# Patient Record
Sex: Male | Born: 1945 | Race: Black or African American | Hispanic: No | Marital: Single | Smoking: Current some day smoker
Health system: Southern US, Community
[De-identification: ages and names within clinical notes are randomized; demographics above are authoritative.]

## PROBLEM LIST (undated history)

## (undated) DIAGNOSIS — E7800 Pure hypercholesterolemia, unspecified: Secondary | ICD-10-CM

## (undated) DIAGNOSIS — R001 Bradycardia, unspecified: Secondary | ICD-10-CM

## (undated) DIAGNOSIS — I10 Essential (primary) hypertension: Secondary | ICD-10-CM

## (undated) DIAGNOSIS — E119 Type 2 diabetes mellitus without complications: Secondary | ICD-10-CM

## (undated) HISTORY — DX: Essential (primary) hypertension: I10

## (undated) HISTORY — PX: INNER EAR SURGERY: SHX679

## (undated) HISTORY — PX: CORNEAL TRANSPLANT: SHX108

## (undated) HISTORY — DX: Type 2 diabetes mellitus without complications: E11.9

## (undated) HISTORY — PX: EYE SURGERY: SHX253

## (undated) HISTORY — DX: Bradycardia, unspecified: R00.1

## (undated) HISTORY — DX: Pure hypercholesterolemia, unspecified: E78.00

---

## 1968-09-17 HISTORY — PX: APPENDECTOMY: SHX54

## 2006-09-17 HISTORY — PX: COLONOSCOPY: SHX174

## 2007-04-21 ENCOUNTER — Encounter: Admission: RE | Admit: 2007-04-21 | Discharge: 2007-04-21 | Payer: Self-pay | Admitting: Family Medicine

## 2007-06-12 ENCOUNTER — Encounter: Admission: RE | Admit: 2007-06-12 | Discharge: 2007-06-25 | Payer: Self-pay | Admitting: Family Medicine

## 2007-07-18 ENCOUNTER — Encounter: Admission: RE | Admit: 2007-07-18 | Discharge: 2007-07-18 | Payer: Self-pay | Admitting: Orthopedic Surgery

## 2008-03-01 ENCOUNTER — Encounter: Admission: RE | Admit: 2008-03-01 | Discharge: 2008-03-09 | Payer: Self-pay | Admitting: Cardiology

## 2009-02-25 ENCOUNTER — Encounter: Payer: Self-pay | Admitting: Cardiology

## 2009-05-26 ENCOUNTER — Encounter: Payer: Self-pay | Admitting: Cardiology

## 2009-08-25 ENCOUNTER — Encounter: Payer: Self-pay | Admitting: Cardiology

## 2009-09-17 DIAGNOSIS — E119 Type 2 diabetes mellitus without complications: Secondary | ICD-10-CM

## 2009-09-17 HISTORY — DX: Type 2 diabetes mellitus without complications: E11.9

## 2010-11-08 NOTE — Letter (Signed)
Summary: [facility] Office Note   [facility] Office Note   Imported By: Roderic Ovens 11/02/2010 12:08:22  _____________________________________________________________________  External Attachment:    Type:   Image     Comment:   External Document

## 2010-11-08 NOTE — Letter (Signed)
Summary: [facility] Office Note   [facility] Office Note   Imported By: Roderic Ovens 11/02/2010 12:10:26  _____________________________________________________________________  External Attachment:    Type:   Image     Comment:   External Document

## 2010-11-08 NOTE — Letter (Signed)
Summary: [facility] Office Note   [facility] Office Note   Imported By: Roderic Ovens 11/02/2010 12:11:35  _____________________________________________________________________  External Attachment:    Type:   Image     Comment:   External Document

## 2011-04-09 ENCOUNTER — Other Ambulatory Visit: Payer: Self-pay | Admitting: Cardiology

## 2012-05-23 ENCOUNTER — Encounter: Payer: Self-pay | Admitting: Cardiology

## 2013-05-25 ENCOUNTER — Ambulatory Visit: Payer: Medicare Other | Attending: Internal Medicine | Admitting: Physical Therapy

## 2013-05-25 DIAGNOSIS — M25579 Pain in unspecified ankle and joints of unspecified foot: Secondary | ICD-10-CM | POA: Insufficient documentation

## 2013-05-25 DIAGNOSIS — IMO0001 Reserved for inherently not codable concepts without codable children: Secondary | ICD-10-CM | POA: Insufficient documentation

## 2015-11-07 DIAGNOSIS — I10 Essential (primary) hypertension: Secondary | ICD-10-CM | POA: Diagnosis not present

## 2015-11-07 DIAGNOSIS — N19 Unspecified kidney failure: Secondary | ICD-10-CM | POA: Diagnosis not present

## 2015-11-07 DIAGNOSIS — E119 Type 2 diabetes mellitus without complications: Secondary | ICD-10-CM | POA: Diagnosis not present

## 2015-11-07 DIAGNOSIS — R5383 Other fatigue: Secondary | ICD-10-CM | POA: Diagnosis not present

## 2015-11-15 DIAGNOSIS — I10 Essential (primary) hypertension: Secondary | ICD-10-CM | POA: Diagnosis not present

## 2015-11-15 DIAGNOSIS — J209 Acute bronchitis, unspecified: Secondary | ICD-10-CM | POA: Diagnosis not present

## 2015-11-15 DIAGNOSIS — M542 Cervicalgia: Secondary | ICD-10-CM | POA: Diagnosis not present

## 2015-11-15 DIAGNOSIS — E119 Type 2 diabetes mellitus without complications: Secondary | ICD-10-CM | POA: Diagnosis not present

## 2016-01-02 DIAGNOSIS — H7201 Central perforation of tympanic membrane, right ear: Secondary | ICD-10-CM | POA: Diagnosis not present

## 2016-01-02 DIAGNOSIS — H903 Sensorineural hearing loss, bilateral: Secondary | ICD-10-CM | POA: Diagnosis not present

## 2016-04-05 DIAGNOSIS — M79609 Pain in unspecified limb: Secondary | ICD-10-CM | POA: Diagnosis not present

## 2016-04-05 DIAGNOSIS — L309 Dermatitis, unspecified: Secondary | ICD-10-CM | POA: Diagnosis not present

## 2016-04-05 DIAGNOSIS — I10 Essential (primary) hypertension: Secondary | ICD-10-CM | POA: Diagnosis not present

## 2016-04-05 DIAGNOSIS — R21 Rash and other nonspecific skin eruption: Secondary | ICD-10-CM | POA: Diagnosis not present

## 2016-04-12 DIAGNOSIS — H40021 Open angle with borderline findings, high risk, right eye: Secondary | ICD-10-CM | POA: Diagnosis not present

## 2016-04-12 DIAGNOSIS — H4032X3 Glaucoma secondary to eye trauma, left eye, severe stage: Secondary | ICD-10-CM | POA: Diagnosis not present

## 2016-04-12 DIAGNOSIS — H40053 Ocular hypertension, bilateral: Secondary | ICD-10-CM | POA: Diagnosis not present

## 2016-04-12 DIAGNOSIS — E119 Type 2 diabetes mellitus without complications: Secondary | ICD-10-CM | POA: Diagnosis not present

## 2016-04-19 DIAGNOSIS — E119 Type 2 diabetes mellitus without complications: Secondary | ICD-10-CM | POA: Diagnosis not present

## 2016-04-19 DIAGNOSIS — I10 Essential (primary) hypertension: Secondary | ICD-10-CM | POA: Diagnosis not present

## 2016-04-19 DIAGNOSIS — L0390 Cellulitis, unspecified: Secondary | ICD-10-CM | POA: Diagnosis not present

## 2016-04-19 DIAGNOSIS — N19 Unspecified kidney failure: Secondary | ICD-10-CM | POA: Diagnosis not present

## 2016-05-03 DIAGNOSIS — H40021 Open angle with borderline findings, high risk, right eye: Secondary | ICD-10-CM | POA: Diagnosis not present

## 2016-05-03 DIAGNOSIS — H40053 Ocular hypertension, bilateral: Secondary | ICD-10-CM | POA: Diagnosis not present

## 2016-05-03 DIAGNOSIS — H4032X3 Glaucoma secondary to eye trauma, left eye, severe stage: Secondary | ICD-10-CM | POA: Diagnosis not present

## 2016-05-24 DIAGNOSIS — I10 Essential (primary) hypertension: Secondary | ICD-10-CM | POA: Diagnosis not present

## 2016-05-24 DIAGNOSIS — R5383 Other fatigue: Secondary | ICD-10-CM | POA: Diagnosis not present

## 2016-05-24 DIAGNOSIS — Z1212 Encounter for screening for malignant neoplasm of rectum: Secondary | ICD-10-CM | POA: Diagnosis not present

## 2016-05-24 DIAGNOSIS — Z0001 Encounter for general adult medical examination with abnormal findings: Secondary | ICD-10-CM | POA: Diagnosis not present

## 2016-05-31 ENCOUNTER — Encounter: Payer: Self-pay | Admitting: Internal Medicine

## 2016-06-06 DIAGNOSIS — E119 Type 2 diabetes mellitus without complications: Secondary | ICD-10-CM | POA: Diagnosis not present

## 2016-06-06 DIAGNOSIS — Z125 Encounter for screening for malignant neoplasm of prostate: Secondary | ICD-10-CM | POA: Diagnosis not present

## 2016-06-06 DIAGNOSIS — N19 Unspecified kidney failure: Secondary | ICD-10-CM | POA: Diagnosis not present

## 2016-06-06 DIAGNOSIS — I10 Essential (primary) hypertension: Secondary | ICD-10-CM | POA: Diagnosis not present

## 2016-06-06 DIAGNOSIS — M545 Low back pain: Secondary | ICD-10-CM | POA: Diagnosis not present

## 2016-06-06 DIAGNOSIS — M79609 Pain in unspecified limb: Secondary | ICD-10-CM | POA: Diagnosis not present

## 2016-06-06 DIAGNOSIS — Z0001 Encounter for general adult medical examination with abnormal findings: Secondary | ICD-10-CM | POA: Diagnosis not present

## 2016-06-06 DIAGNOSIS — R5383 Other fatigue: Secondary | ICD-10-CM | POA: Diagnosis not present

## 2016-06-06 DIAGNOSIS — R300 Dysuria: Secondary | ICD-10-CM | POA: Diagnosis not present

## 2016-06-19 DIAGNOSIS — I10 Essential (primary) hypertension: Secondary | ICD-10-CM | POA: Diagnosis not present

## 2016-06-19 DIAGNOSIS — J209 Acute bronchitis, unspecified: Secondary | ICD-10-CM | POA: Diagnosis not present

## 2016-06-19 DIAGNOSIS — R5383 Other fatigue: Secondary | ICD-10-CM | POA: Diagnosis not present

## 2016-06-19 DIAGNOSIS — E7800 Pure hypercholesterolemia, unspecified: Secondary | ICD-10-CM | POA: Diagnosis not present

## 2016-07-02 DIAGNOSIS — H4032X3 Glaucoma secondary to eye trauma, left eye, severe stage: Secondary | ICD-10-CM | POA: Diagnosis not present

## 2016-07-02 DIAGNOSIS — I10 Essential (primary) hypertension: Secondary | ICD-10-CM | POA: Diagnosis not present

## 2016-07-02 DIAGNOSIS — R739 Hyperglycemia, unspecified: Secondary | ICD-10-CM | POA: Diagnosis not present

## 2016-07-02 DIAGNOSIS — H40021 Open angle with borderline findings, high risk, right eye: Secondary | ICD-10-CM | POA: Diagnosis not present

## 2016-07-02 DIAGNOSIS — H40053 Ocular hypertension, bilateral: Secondary | ICD-10-CM | POA: Diagnosis not present

## 2016-07-02 DIAGNOSIS — E7800 Pure hypercholesterolemia, unspecified: Secondary | ICD-10-CM | POA: Diagnosis not present

## 2016-07-02 DIAGNOSIS — R5383 Other fatigue: Secondary | ICD-10-CM | POA: Diagnosis not present

## 2016-08-15 DIAGNOSIS — H40001 Preglaucoma, unspecified, right eye: Secondary | ICD-10-CM | POA: Diagnosis not present

## 2016-08-15 DIAGNOSIS — H4032X3 Glaucoma secondary to eye trauma, left eye, severe stage: Secondary | ICD-10-CM | POA: Diagnosis not present

## 2016-10-11 DIAGNOSIS — H539 Unspecified visual disturbance: Secondary | ICD-10-CM | POA: Diagnosis not present

## 2016-10-11 DIAGNOSIS — I10 Essential (primary) hypertension: Secondary | ICD-10-CM | POA: Diagnosis not present

## 2016-10-11 DIAGNOSIS — M79609 Pain in unspecified limb: Secondary | ICD-10-CM | POA: Diagnosis not present

## 2016-10-11 DIAGNOSIS — H5712 Ocular pain, left eye: Secondary | ICD-10-CM | POA: Diagnosis not present

## 2016-11-05 DIAGNOSIS — H4032X2 Glaucoma secondary to eye trauma, left eye, moderate stage: Secondary | ICD-10-CM | POA: Diagnosis not present

## 2016-11-05 DIAGNOSIS — H4032X3 Glaucoma secondary to eye trauma, left eye, severe stage: Secondary | ICD-10-CM | POA: Diagnosis not present

## 2016-11-05 DIAGNOSIS — H40001 Preglaucoma, unspecified, right eye: Secondary | ICD-10-CM | POA: Diagnosis not present

## 2016-11-05 DIAGNOSIS — H25813 Combined forms of age-related cataract, bilateral: Secondary | ICD-10-CM | POA: Diagnosis not present

## 2016-11-08 DIAGNOSIS — H4032X3 Glaucoma secondary to eye trauma, left eye, severe stage: Secondary | ICD-10-CM | POA: Diagnosis not present

## 2016-11-12 DIAGNOSIS — H4032X3 Glaucoma secondary to eye trauma, left eye, severe stage: Secondary | ICD-10-CM | POA: Diagnosis not present

## 2016-11-15 DIAGNOSIS — H4032X3 Glaucoma secondary to eye trauma, left eye, severe stage: Secondary | ICD-10-CM | POA: Diagnosis not present

## 2016-11-16 DIAGNOSIS — H43812 Vitreous degeneration, left eye: Secondary | ICD-10-CM | POA: Diagnosis not present

## 2016-11-16 DIAGNOSIS — H40832 Aqueous misdirection, left eye: Secondary | ICD-10-CM | POA: Diagnosis not present

## 2016-11-16 DIAGNOSIS — H31092 Other chorioretinal scars, left eye: Secondary | ICD-10-CM | POA: Diagnosis not present

## 2016-11-16 DIAGNOSIS — H40212 Acute angle-closure glaucoma, left eye: Secondary | ICD-10-CM | POA: Diagnosis not present

## 2016-11-19 DIAGNOSIS — H31412 Hemorrhagic choroidal detachment, left eye: Secondary | ICD-10-CM | POA: Diagnosis not present

## 2016-11-19 DIAGNOSIS — H31302 Unspecified choroidal hemorrhage, left eye: Secondary | ICD-10-CM | POA: Diagnosis not present

## 2016-11-19 DIAGNOSIS — H40832 Aqueous misdirection, left eye: Secondary | ICD-10-CM | POA: Diagnosis not present

## 2016-11-23 DIAGNOSIS — R5383 Other fatigue: Secondary | ICD-10-CM | POA: Diagnosis not present

## 2016-11-23 DIAGNOSIS — I10 Essential (primary) hypertension: Secondary | ICD-10-CM | POA: Diagnosis not present

## 2016-11-23 DIAGNOSIS — R0602 Shortness of breath: Secondary | ICD-10-CM | POA: Diagnosis not present

## 2016-11-23 DIAGNOSIS — R079 Chest pain, unspecified: Secondary | ICD-10-CM | POA: Diagnosis not present

## 2016-11-29 DIAGNOSIS — E7800 Pure hypercholesterolemia, unspecified: Secondary | ICD-10-CM | POA: Diagnosis not present

## 2016-11-29 DIAGNOSIS — I10 Essential (primary) hypertension: Secondary | ICD-10-CM | POA: Diagnosis not present

## 2016-11-29 DIAGNOSIS — E669 Obesity, unspecified: Secondary | ICD-10-CM | POA: Diagnosis not present

## 2016-11-29 DIAGNOSIS — N529 Male erectile dysfunction, unspecified: Secondary | ICD-10-CM | POA: Diagnosis not present

## 2016-12-27 DIAGNOSIS — I10 Essential (primary) hypertension: Secondary | ICD-10-CM

## 2017-01-04 DIAGNOSIS — H40832 Aqueous misdirection, left eye: Secondary | ICD-10-CM | POA: Diagnosis not present

## 2017-01-04 DIAGNOSIS — H43812 Vitreous degeneration, left eye: Secondary | ICD-10-CM | POA: Diagnosis not present

## 2017-01-15 ENCOUNTER — Encounter: Payer: Self-pay | Admitting: Cardiology

## 2017-01-15 ENCOUNTER — Ambulatory Visit (INDEPENDENT_AMBULATORY_CARE_PROVIDER_SITE_OTHER): Payer: PPO | Admitting: Cardiology

## 2017-01-15 ENCOUNTER — Encounter (INDEPENDENT_AMBULATORY_CARE_PROVIDER_SITE_OTHER): Payer: Self-pay

## 2017-01-15 VITALS — BP 176/92 | HR 82 | Ht 70.0 in | Wt 234.4 lb

## 2017-01-15 DIAGNOSIS — Z6835 Body mass index (BMI) 35.0-35.9, adult: Secondary | ICD-10-CM

## 2017-01-15 DIAGNOSIS — F17200 Nicotine dependence, unspecified, uncomplicated: Secondary | ICD-10-CM

## 2017-01-15 DIAGNOSIS — I10 Essential (primary) hypertension: Secondary | ICD-10-CM | POA: Diagnosis not present

## 2017-01-15 DIAGNOSIS — IMO0001 Reserved for inherently not codable concepts without codable children: Secondary | ICD-10-CM | POA: Insufficient documentation

## 2017-01-15 MED ORDER — LISINOPRIL 10 MG PO TABS: 10.0000 mg | ORAL_TABLET | Freq: Every day | ORAL | Status: DC

## 2017-01-15 MED ORDER — ATENOLOL 50 MG PO TABS: 50.0000 mg | ORAL_TABLET | Freq: Every day | ORAL | Status: DC

## 2017-01-15 MED ORDER — AMLODIPINE BESYLATE 10 MG PO TABS: 10.0000 mg | ORAL_TABLET | Freq: Every day | ORAL | Status: AC

## 2017-01-15 NOTE — Patient Instructions (Signed)
Medication Instructions:  Please restart Amlodipine 10 mg a day, Lisinopril 10 mg a day and Atenolol 50 mg a day. Continue other medications as listed.  Follow-Up: Follow up in 4 weeks with Nell Range, PA.  If you need a refill on your cardiac medications before your next appointment, please call your pharmacy.  Thank you for choosing [facility]!!

## 2017-01-15 NOTE — Progress Notes (Signed)
Cardiology Office Note:    Date:  01/15/2017   ID:  Alex Myers, DOB 1945-11-27, MRN [id number]  PCP:  Antonietta Jewel, MD  Cardiologist:  Candee Furbish, MD    Referring MD: No ref. provider found  Previously saw Dr. Mare Ferrari.   History of Present Illness:    Alex Myers is a 71 y.o. male is here for evaluation of wide pulse pressure and hypertension at the request of Dr. Antonietta Jewel. In review of office note from 11/29/16, initial blood pressure was 190/60 then decreasing to 170/80.   He also has comorbidities of erectile dysfunction and obesity and hyperlipidemia.  It is been several years since he is seen Dr. Mare Ferrari. Back in 2006 he had a stress test performed at [facility] cardiology. No ischemia, normal EF.  He states that he lost about 60 pounds and had come off of some of his diabetes medications. When he was about to undergo eye surgery, his blood pressure was very low. He remembers the doctor telling him that it was so low that they would have to do CPR on him if they tried to put him under general anesthesia. At that time, he was taking for blood pressure medications, amlodipine 10 mg once a day, hydrochlorothiazide 25 mg once a day, lisinopril 10 mg once a day and atenolol 50 mg once a day. He had been on these medications for several years, for years I believe he stated without difficulty. He was asymptomatic at the time the pressures were low. Chest pain, no syncope, no orthopnea, no PND. No fevers.  After his blood pressures were low, his medications were manipulated. Labetalol was added 100 mg twice a day, hydralazine 50 mg twice a day was added. He is unsure that these medication changes have helped. His blood pressure still remains high.   176/92 today-he has not been on any of his medications.    Past Medical History:  Diagnosis Date  . DM (diabetes mellitus) ([place])    DIET CONTROL  . HTN (hypertension)   . Hypercholesterolemia     Past Surgical History:  Procedure  Laterality Date  . APPENDECTOMY  1970    Current Medications: Current Meds  Medication Sig  . dorzolamide-timolol (COSOPT) 22.3-6.8 MG/ML ophthalmic solution Place 1 drop into the left eye 2 (two) times daily.     Allergies:   Patient has no known allergies.   Social History   Social History  . Marital status: Single    Spouse name: N/A  . Number of children: N/A  . Years of education: N/A   Social History Main Topics  . Smoking status: Current Some Day Smoker  . Smokeless tobacco: Never Used  . Alcohol use Yes     Comment: rarely  . Drug use: Unknown  . Sexual activity: Not Asked   Other Topics Concern  . None   Social History Narrative  . None     family history includes Hernia in his brother; Hyperlipidemia in his sister; Hypertension (age of onset: 27) in his mother; Other (age of onset: 79) in his father. ROS:   Please see the history of present illness.     All other systems reviewed and are negative.   EKGs/Labs/Other Studies Reviewed:    EKG: Prior EKG from 11/22/16 demonstrates sinus rhythm with first degree AV block PR interval of 234 ms, borderline LVH. Personally viewed. Prior office notes reviewed.  Recent Labs: No results found for requested labs within last 8760 hours.   Recent Lipid  Panel No results found for: CHOL, TRIG, HDL, CHOLHDL, VLDL, LDLCALC, LDLDIRECT  Physical Exam:    VS:  BP (!) 176/92 (BP Location: Left Arm)   Pulse 82   Ht 5\' 10"  (1.778 m)   Wt 234 lb 6.4 oz (106.3 kg)   BMI 33.63 kg/m     Wt Readings from Last 3 Encounters:  01/15/17 234 lb 6.4 oz (106.3 kg)     GEN:  Well nourished, well developed in no acute distress HEENT: Normal NECK: No JVD; No carotid bruits LYMPHATICS: No lymphadenopathy CARDIAC: RRR, no murmurs, rubs, gallops RESPIRATORY:  Clear to auscultation without rales, wheezing or rhonchi  ABDOMEN: Soft, non-tender, non-distended MUSCULOSKELETAL:  No edema; No deformity  SKIN: Warm and dry NEUROLOGIC:   Alert and oriented x 3 PSYCHIATRIC:  Normal affect   ASSESSMENT:    1. Essential hypertension   2. Class 2 severe obesity due to excess calories with serious comorbidity and body mass index (BMI) of 35.0 to 35.9 in adult [facility])   3. Smoking    PLAN:    In order of problems listed above:  Essential hypertension  - Difficult to control. There was concern about wide pulse pressure. Initial blood pressure reading was 190/60 down to 170/80.  - Many of his blood pressure medications have been changed recently. He states that in the past when he took his for previous medications his blood pressure was under good control. In fact during recent eye surgery, it was quite low.  - I will stop any new blood pressure medications and resume 3 of the 4 medications he used to take in the past, amlodipine, lisinopril, atenolol as stated above in history of present illness.  - If his blood pressure remains elevated at return clinic visit in 4 weeks, start HCTZ 25 mg back.  - Try to avoid excessive ibuprofen.  Smoking  - He does state that he [place] and also has smoked marijuana for 30 years. Encouraged cessation.  Left eye vision loss  - He is going to see [facility] soon for evaluation.  Obesity  - Continue to encourage weight loss. He has lost 60 pounds.  Previously wide pulse pressure  - On repeat blood pressure here today, pulse pressure is within normal range. Sometimes as we age, vascular stiffness/compliance changes can occur and can result in a widened pulse pressure. This is not unusual. Other cardiac diagnoses 2 think of include chronic aortic regurgitation. I do not hear any murmur suggestive of aortic regurgitation nor does he have an increased carotid impulse.  - Continue treatment of hypertension.   Medication Adjustments/Labs and Tests Ordered: Current medicines are reviewed at length with the patient today.  Concerns regarding medicines are outlined  above. Labs and tests ordered and medication changes are outlined in the patient instructions below:  Patient Instructions  Medication Instructions:  Please restart Amlodipine 10 mg a day, Lisinopril 10 mg a day and Atenolol 50 mg a day. Continue other medications as listed.  Follow-Up: Follow up in 4 weeks with Nell Range, PA.  If you need a refill on your cardiac medications before your next appointment, please call your pharmacy.  Thank you for choosing [facility]!!        Signed, Candee Furbish, MD  01/15/2017 9:12 AM    [place]

## 2017-01-29 DIAGNOSIS — H4032X4 Glaucoma secondary to eye trauma, left eye, indeterminate stage: Secondary | ICD-10-CM | POA: Diagnosis not present

## 2017-01-29 DIAGNOSIS — H903 Sensorineural hearing loss, bilateral: Secondary | ICD-10-CM | POA: Diagnosis not present

## 2017-01-29 DIAGNOSIS — H6123 Impacted cerumen, bilateral: Secondary | ICD-10-CM | POA: Diagnosis not present

## 2017-01-29 DIAGNOSIS — H7201 Central perforation of tympanic membrane, right ear: Secondary | ICD-10-CM | POA: Diagnosis not present

## 2017-01-30 DIAGNOSIS — H409 Unspecified glaucoma: Secondary | ICD-10-CM | POA: Insufficient documentation

## 2017-02-07 NOTE — Progress Notes (Signed)
Cardiology Office Note    Date:  02/12/2017   ID:  Alex Myers, DOB [birth year], MRN [id number]  PCP:  Antonietta Jewel, MD  Cardiologist:  Dr. Marlou Porch   CC: follow up   History of Present Illness:  Alex Myers is a 71 y.o. male with a history of HTN, DMT2, obesity, occasional cigar/marjuana use and obesity who presents to clinic for follow up.  He has been seen by Dr. Mare Ferrari in the past. He had a stress test performed at [facility] cardiology in 2006 that showed  no ischemia, normal EF.  He has lost about 60 lbs since 2012 and was able to come off a lot of medications for DM and HTN. At one time, his BP got so low that he was unable to have eye surgery. Then BP started to creep back up and Dr. Sheryle Hail started adding back medications.   He was referred back to Dr. Marlou Porch for evaluation of HTN and wide pulse pressure by Dr. Sheryle Hail on 01/15/17. It was felt this pulse pressure was normal at the time of visit and that this was not an unusual finding given age and increased vascular stiffness. There was no murmur worrisome for aortic regurgication.  His BP remained high and he was restarted on amlodipine 10mg  daily, lisinopril 10mg  daily and atenolol 50mg  daily.   Today he presents to clinic for follow up. No CP or SOB. No LE edema, orthopnea or PND. No dizziness or syncope. No blood in stool or urine. No palpitations. He does intense water aerobics 3x a week. He tries to eat healthy.   Past Medical History:  Diagnosis Date  . DM (diabetes mellitus) ([place])    DIET CONTROL  . HTN (hypertension)   . Hypercholesterolemia     Past Surgical History:  Procedure Laterality Date  . APPENDECTOMY  1970    Current Medications: Outpatient Medications Prior to Visit  Medication Sig Dispense Refill  . amLODipine (NORVASC) 10 MG tablet Take 1 tablet (10 mg total) by mouth daily.    Marland Kitchen atenolol (TENORMIN) 50 MG tablet Take 1 tablet (50 mg total) by mouth daily.    . dorzolamide-timolol (COSOPT)  22.3-6.8 MG/ML ophthalmic solution Place 1 drop into the left eye 2 (two) times daily.    Marland Kitchen lisinopril (PRINIVIL,ZESTRIL) 10 MG tablet Take 1 tablet (10 mg total) by mouth daily.     No facility-administered medications prior to visit.      Allergies:   Patient has no known allergies.   Social History   Social History  . Marital status: Single    Spouse name: N/A  . Number of children: N/A  . Years of education: N/A   Social History Main Topics  . Smoking status: Current Some Day Smoker  . Smokeless tobacco: Never Used  . Alcohol use Yes     Comment: rarely  . Drug use: Unknown  . Sexual activity: Not Asked   Other Topics Concern  . None   Social History Narrative  . None     Family History:  The patient's family history includes Hernia in his brother; Hyperlipidemia in his sister; Hypertension (age of onset: 72) in his mother; Other (age of onset: 53) in his father.      ROS:   Please see the history of present illness.    ROS All other systems reviewed and are negative.   PHYSICAL EXAM:   VS:  BP (!) 150/70   Pulse 74   Ht 5\' 8"  (1.727  m)   Wt 239 lb (108.4 kg)   BMI 36.34 kg/m    GEN: Well nourished, well developed, in no acute distress  HEENT: normal  Neck: no JVD, carotid bruits, or masses Cardiac: RRR; no murmurs, rubs, or gallops,no edema  Respiratory:  clear to auscultation bilaterally, normal work of breathing GI: soft, nontender, nondistended, + BS MS: no deformity or atrophy  Skin: warm and dry, no rash Neuro:  Alert and Oriented x 3, Strength and sensation are intact Psych: euthymic mood, full affect    Wt Readings from Last 3 Encounters:  02/12/17 239 lb (108.4 kg)  01/15/17 234 lb 6.4 oz (106.3 kg)      Studies/Labs Reviewed:   ECG is not ordered  Recent Labs: No results found for requested labs within last 8760 hours.   Lipid Panel No results found for: CHOL, TRIG, HDL, CHOLHDL, VLDL, LDLCALC, LDLDIRECT  Additional studies/  records that were reviewed today include:  none   ASSESSMENT & PLAN:   HTN: BP initially elevated by 130/80 upon my personal recheck. Pulse pressure normal . Continue current regimen of amlodipine 10mg  daily, atenolol 50mg  daily and lisinopril 10mg  daily. His BP is now under good control and I am comfortable with Dr. Sheryle Hail following this. He can see cardiology as needed.   Obesity: Body mass index is 36.34 kg/m. He works hard on exercise and diet  Tobacco abuse: occasional use. Discussed importance of complete cessation.   DMT2: controlled with diet and exercise   Medication Adjustments/Labs and Tests Ordered: Current medicines are reviewed at length with the patient today.  Concerns regarding medicines are outlined above.  Medication changes, Labs and Tests ordered today are listed in the Patient Instructions below. Patient Instructions  Medication Instructions:  Your physician recommends that you continue on your current medications as directed. Please refer to the Current Medication list given to you today.   Labwork: None ordered  Testing/Procedures: None ordered  Follow-Up: Your physician recommends that you schedule a follow-up appointment in: AS NEEDED   Any Other Special Instructions Will Be Listed Below (If Applicable).     If you need a refill on your cardiac medications before your next appointment, please call your pharmacy.      Signed, Angelena Form, PA-C  02/12/2017 8:54 AM    [facility] [place], [place], [place]  [address] Phone: [phone]; Fax: [phone]

## 2017-02-12 ENCOUNTER — Encounter: Payer: Self-pay | Admitting: Physician Assistant

## 2017-02-12 ENCOUNTER — Ambulatory Visit (INDEPENDENT_AMBULATORY_CARE_PROVIDER_SITE_OTHER): Payer: PPO | Admitting: Physician Assistant

## 2017-02-12 VITALS — BP 150/70 | HR 74 | Ht 68.0 in | Wt 239.0 lb

## 2017-02-12 DIAGNOSIS — E118 Type 2 diabetes mellitus with unspecified complications: Secondary | ICD-10-CM | POA: Diagnosis not present

## 2017-02-12 DIAGNOSIS — I10 Essential (primary) hypertension: Secondary | ICD-10-CM

## 2017-02-12 DIAGNOSIS — F17200 Nicotine dependence, unspecified, uncomplicated: Secondary | ICD-10-CM | POA: Diagnosis not present

## 2017-02-12 NOTE — Patient Instructions (Signed)
Medication Instructions:  Your physician recommends that you continue on your current medications as directed. Please refer to the Current Medication list given to you today.   Labwork: None ordered  Testing/Procedures: None ordered  Follow-Up: Your physician recommends that you schedule a follow-up appointment in: AS NEEDED   Any Other Special Instructions Will Be Listed Below (If Applicable).     If you need a refill on your cardiac medications before your next appointment, please call your pharmacy.   

## 2017-02-21 DIAGNOSIS — F17290 Nicotine dependence, other tobacco product, uncomplicated: Secondary | ICD-10-CM | POA: Diagnosis not present

## 2017-02-21 DIAGNOSIS — I10 Essential (primary) hypertension: Secondary | ICD-10-CM | POA: Diagnosis not present

## 2017-02-21 DIAGNOSIS — H42 Glaucoma in diseases classified elsewhere: Secondary | ICD-10-CM | POA: Diagnosis not present

## 2017-02-21 DIAGNOSIS — H4089 Other specified glaucoma: Secondary | ICD-10-CM | POA: Diagnosis not present

## 2017-02-21 DIAGNOSIS — F1210 Cannabis abuse, uncomplicated: Secondary | ICD-10-CM | POA: Diagnosis not present

## 2017-02-21 DIAGNOSIS — E1139 Type 2 diabetes mellitus with other diabetic ophthalmic complication: Secondary | ICD-10-CM | POA: Diagnosis not present

## 2017-02-21 DIAGNOSIS — H409 Unspecified glaucoma: Secondary | ICD-10-CM | POA: Diagnosis not present

## 2017-05-14 DIAGNOSIS — H269 Unspecified cataract: Secondary | ICD-10-CM | POA: Insufficient documentation

## 2017-05-14 DIAGNOSIS — H25012 Cortical age-related cataract, left eye: Secondary | ICD-10-CM | POA: Diagnosis not present

## 2017-06-06 DIAGNOSIS — Z5309 Procedure and treatment not carried out because of other contraindication: Secondary | ICD-10-CM | POA: Diagnosis not present

## 2017-06-06 DIAGNOSIS — F17290 Nicotine dependence, other tobacco product, uncomplicated: Secondary | ICD-10-CM | POA: Diagnosis not present

## 2017-06-06 DIAGNOSIS — Z79899 Other long term (current) drug therapy: Secondary | ICD-10-CM | POA: Diagnosis not present

## 2017-06-06 DIAGNOSIS — I441 Atrioventricular block, second degree: Secondary | ICD-10-CM | POA: Diagnosis not present

## 2017-06-06 DIAGNOSIS — R001 Bradycardia, unspecified: Secondary | ICD-10-CM | POA: Diagnosis not present

## 2017-06-06 DIAGNOSIS — R9431 Abnormal electrocardiogram [ECG] [EKG]: Secondary | ICD-10-CM | POA: Diagnosis not present

## 2017-06-06 DIAGNOSIS — E669 Obesity, unspecified: Secondary | ICD-10-CM | POA: Diagnosis not present

## 2017-06-06 DIAGNOSIS — E1136 Type 2 diabetes mellitus with diabetic cataract: Secondary | ICD-10-CM | POA: Diagnosis not present

## 2017-06-06 DIAGNOSIS — I10 Essential (primary) hypertension: Secondary | ICD-10-CM | POA: Diagnosis not present

## 2017-06-06 DIAGNOSIS — E785 Hyperlipidemia, unspecified: Secondary | ICD-10-CM | POA: Diagnosis not present

## 2017-06-06 DIAGNOSIS — E119 Type 2 diabetes mellitus without complications: Secondary | ICD-10-CM | POA: Diagnosis not present

## 2017-06-06 DIAGNOSIS — H409 Unspecified glaucoma: Secondary | ICD-10-CM | POA: Diagnosis not present

## 2017-06-07 DIAGNOSIS — I10 Essential (primary) hypertension: Secondary | ICD-10-CM | POA: Insufficient documentation

## 2017-06-07 DIAGNOSIS — I441 Atrioventricular block, second degree: Secondary | ICD-10-CM | POA: Insufficient documentation

## 2017-06-27 DIAGNOSIS — I10 Essential (primary) hypertension: Secondary | ICD-10-CM | POA: Diagnosis not present

## 2017-06-27 DIAGNOSIS — I441 Atrioventricular block, second degree: Secondary | ICD-10-CM | POA: Diagnosis not present

## 2017-07-04 DIAGNOSIS — H409 Unspecified glaucoma: Secondary | ICD-10-CM | POA: Diagnosis not present

## 2017-07-04 DIAGNOSIS — H26132 Total traumatic cataract, left eye: Secondary | ICD-10-CM | POA: Diagnosis not present

## 2017-07-04 DIAGNOSIS — H2589 Other age-related cataract: Secondary | ICD-10-CM | POA: Diagnosis not present

## 2017-07-04 DIAGNOSIS — I10 Essential (primary) hypertension: Secondary | ICD-10-CM | POA: Diagnosis not present

## 2017-07-04 DIAGNOSIS — E1136 Type 2 diabetes mellitus with diabetic cataract: Secondary | ICD-10-CM | POA: Diagnosis not present

## 2017-07-16 DIAGNOSIS — H66011 Acute suppurative otitis media with spontaneous rupture of ear drum, right ear: Secondary | ICD-10-CM | POA: Diagnosis not present

## 2017-07-30 DIAGNOSIS — H7201 Central perforation of tympanic membrane, right ear: Secondary | ICD-10-CM | POA: Diagnosis not present

## 2017-07-30 DIAGNOSIS — H6983 Other specified disorders of Eustachian tube, bilateral: Secondary | ICD-10-CM | POA: Diagnosis not present

## 2017-10-01 DIAGNOSIS — H1820 Unspecified corneal edema: Secondary | ICD-10-CM | POA: Diagnosis not present

## 2017-10-01 DIAGNOSIS — H40051 Ocular hypertension, right eye: Secondary | ICD-10-CM | POA: Diagnosis not present

## 2017-10-01 DIAGNOSIS — H532 Diplopia: Secondary | ICD-10-CM | POA: Diagnosis not present

## 2017-10-01 DIAGNOSIS — H401120 Primary open-angle glaucoma, left eye, stage unspecified: Secondary | ICD-10-CM | POA: Diagnosis not present

## 2017-10-01 DIAGNOSIS — H4010X0 Unspecified open-angle glaucoma, stage unspecified: Secondary | ICD-10-CM | POA: Diagnosis not present

## 2017-10-29 DIAGNOSIS — H532 Diplopia: Secondary | ICD-10-CM | POA: Diagnosis not present

## 2017-10-29 DIAGNOSIS — H401124 Primary open-angle glaucoma, left eye, indeterminate stage: Secondary | ICD-10-CM | POA: Diagnosis not present

## 2017-10-29 DIAGNOSIS — H1820 Unspecified corneal edema: Secondary | ICD-10-CM | POA: Diagnosis not present

## 2017-11-19 DIAGNOSIS — E119 Type 2 diabetes mellitus without complications: Secondary | ICD-10-CM | POA: Diagnosis not present

## 2017-11-19 DIAGNOSIS — I10 Essential (primary) hypertension: Secondary | ICD-10-CM | POA: Diagnosis not present

## 2017-11-19 DIAGNOSIS — Z0001 Encounter for general adult medical examination with abnormal findings: Secondary | ICD-10-CM | POA: Diagnosis not present

## 2017-11-19 DIAGNOSIS — E7800 Pure hypercholesterolemia, unspecified: Secondary | ICD-10-CM | POA: Diagnosis not present

## 2017-11-20 DIAGNOSIS — H532 Diplopia: Secondary | ICD-10-CM | POA: Diagnosis not present

## 2017-11-20 DIAGNOSIS — H40051 Ocular hypertension, right eye: Secondary | ICD-10-CM | POA: Diagnosis not present

## 2017-11-20 DIAGNOSIS — H1820 Unspecified corneal edema: Secondary | ICD-10-CM | POA: Diagnosis not present

## 2017-11-20 DIAGNOSIS — H40002 Preglaucoma, unspecified, left eye: Secondary | ICD-10-CM | POA: Diagnosis not present

## 2017-12-04 DIAGNOSIS — E119 Type 2 diabetes mellitus without complications: Secondary | ICD-10-CM | POA: Diagnosis not present

## 2017-12-04 DIAGNOSIS — E7800 Pure hypercholesterolemia, unspecified: Secondary | ICD-10-CM | POA: Diagnosis not present

## 2017-12-04 DIAGNOSIS — E669 Obesity, unspecified: Secondary | ICD-10-CM | POA: Diagnosis not present

## 2017-12-04 DIAGNOSIS — I10 Essential (primary) hypertension: Secondary | ICD-10-CM | POA: Diagnosis not present

## 2017-12-17 DIAGNOSIS — H1820 Unspecified corneal edema: Secondary | ICD-10-CM | POA: Diagnosis not present

## 2017-12-17 DIAGNOSIS — I441 Atrioventricular block, second degree: Secondary | ICD-10-CM | POA: Diagnosis not present

## 2017-12-17 DIAGNOSIS — E119 Type 2 diabetes mellitus without complications: Secondary | ICD-10-CM | POA: Diagnosis not present

## 2017-12-17 DIAGNOSIS — Z01810 Encounter for preprocedural cardiovascular examination: Secondary | ICD-10-CM | POA: Diagnosis not present

## 2017-12-17 DIAGNOSIS — R001 Bradycardia, unspecified: Secondary | ICD-10-CM | POA: Diagnosis not present

## 2017-12-17 DIAGNOSIS — E669 Obesity, unspecified: Secondary | ICD-10-CM | POA: Diagnosis not present

## 2017-12-17 DIAGNOSIS — I10 Essential (primary) hypertension: Secondary | ICD-10-CM | POA: Diagnosis not present

## 2017-12-17 DIAGNOSIS — F17290 Nicotine dependence, other tobacco product, uncomplicated: Secondary | ICD-10-CM | POA: Diagnosis not present

## 2017-12-17 DIAGNOSIS — Z6837 Body mass index (BMI) 37.0-37.9, adult: Secondary | ICD-10-CM | POA: Diagnosis not present

## 2017-12-17 DIAGNOSIS — H1851 Endothelial corneal dystrophy: Secondary | ICD-10-CM | POA: Diagnosis not present

## 2017-12-17 DIAGNOSIS — Z79899 Other long term (current) drug therapy: Secondary | ICD-10-CM | POA: Diagnosis not present

## 2017-12-23 ENCOUNTER — Encounter: Payer: Self-pay | Admitting: Gastroenterology

## 2017-12-31 DIAGNOSIS — R609 Edema, unspecified: Secondary | ICD-10-CM | POA: Diagnosis not present

## 2017-12-31 DIAGNOSIS — R5383 Other fatigue: Secondary | ICD-10-CM | POA: Diagnosis not present

## 2017-12-31 DIAGNOSIS — E7800 Pure hypercholesterolemia, unspecified: Secondary | ICD-10-CM | POA: Diagnosis not present

## 2017-12-31 DIAGNOSIS — I10 Essential (primary) hypertension: Secondary | ICD-10-CM | POA: Diagnosis not present

## 2018-01-28 DIAGNOSIS — H7201 Central perforation of tympanic membrane, right ear: Secondary | ICD-10-CM | POA: Diagnosis not present

## 2018-01-28 DIAGNOSIS — H903 Sensorineural hearing loss, bilateral: Secondary | ICD-10-CM | POA: Diagnosis not present

## 2018-02-17 ENCOUNTER — Ambulatory Visit (AMBULATORY_SURGERY_CENTER): Payer: Self-pay | Admitting: *Deleted

## 2018-02-17 ENCOUNTER — Other Ambulatory Visit: Payer: Self-pay

## 2018-02-17 VITALS — Ht 67.0 in | Wt 231.0 lb

## 2018-02-17 DIAGNOSIS — Z1211 Encounter for screening for malignant neoplasm of colon: Secondary | ICD-10-CM

## 2018-02-17 MED ORDER — PEG-KCL-NACL-NASULF-NA ASC-C 140 G PO SOLR: 1.0000 | Freq: Once | ORAL | 0 refills | Status: AC

## 2018-02-17 NOTE — Progress Notes (Signed)
Patient denies any allergies to eggs or soy. Patient denies any problems with anesthesia/sedation. Patient denies any oxygen use at home. Patient denies taking any diet/weight loss medications or blood thinners. EMMI education offered, pt declined. Plenvu sample given to pt, because [employer] insurance does not cover this.

## 2018-02-21 ENCOUNTER — Encounter: Payer: Self-pay | Admitting: Gastroenterology

## 2018-03-03 ENCOUNTER — Other Ambulatory Visit: Payer: Self-pay

## 2018-03-03 ENCOUNTER — Encounter: Payer: Self-pay | Admitting: Gastroenterology

## 2018-03-03 ENCOUNTER — Ambulatory Visit (AMBULATORY_SURGERY_CENTER): Payer: PPO | Admitting: Gastroenterology

## 2018-03-03 VITALS — BP 131/77 | HR 59 | Temp 98.9°F | Resp 12 | Wt 231.0 lb

## 2018-03-03 DIAGNOSIS — I10 Essential (primary) hypertension: Secondary | ICD-10-CM | POA: Diagnosis not present

## 2018-03-03 DIAGNOSIS — D123 Benign neoplasm of transverse colon: Secondary | ICD-10-CM | POA: Diagnosis not present

## 2018-03-03 DIAGNOSIS — D122 Benign neoplasm of ascending colon: Secondary | ICD-10-CM

## 2018-03-03 DIAGNOSIS — Z1211 Encounter for screening for malignant neoplasm of colon: Secondary | ICD-10-CM | POA: Diagnosis not present

## 2018-03-03 MED ORDER — SODIUM CHLORIDE 0.9 % IV SOLN: 500.0000 mL | Freq: Once | INTRAVENOUS | Status: DC

## 2018-03-03 NOTE — Op Note (Signed)
[name]: Alex Myers Procedure Date: 03/03/2018 8:14 AM MRN: [id number] Endoscopist: Mallie Mussel L. Loletha Carrow , MD Age: 72 Referring MD:  Date of Birth: December 09, 1945 Gender: Male Account #: [id number] Procedure:                Colonoscopy Indications:              Screening for colorectal malignant neoplasm                            (reportedly no polyps on 2008 colonoscopy) Medicines:                Monitored Anesthesia Care Procedure:                Pre-Anesthesia Assessment:                           - Prior to the procedure, a History and Physical                            was performed, and patient medications and                            allergies were reviewed. The patient's tolerance of                            previous anesthesia was also reviewed. The risks                            and benefits of the procedure and the sedation                            options and risks were discussed with the patient.                            All questions were answered, and informed consent                            was obtained. Prior Anticoagulants: The patient has                            taken no previous anticoagulant or antiplatelet                            agents. ASA Grade Assessment: II - A patient with                            mild systemic disease. After reviewing the risks                            and benefits, the patient was deemed in                            satisfactory condition to undergo the procedure.  After obtaining informed consent, the colonoscope                            was passed under direct vision. Throughout the                            procedure, the patient's blood pressure, pulse, and                            oxygen saturations were monitored continuously. The                            Colonoscope was introduced through the anus and                            advanced to the the cecum,  identified by                            appendiceal orifice and ileocecal valve. The                            colonoscopy was performed without difficulty. The                            patient tolerated the procedure well. The quality                            of the bowel preparation was excellent. The                            ileocecal valve, appendiceal orifice, and rectum                            were photographed. The quality of the bowel                            preparation was evaluated using the BBPS [facility]                            Bowel Preparation Scale) with scores of: Right                            Colon = 3, Transverse Colon = 3 and Left Colon = 3                            (entire mucosa seen well with no residual staining,                            small fragments of stool or opaque liquid). The                            total BBPS score equals 9. The bowel preparation  used was Plenvu. Scope In: 8:16:07 AM Scope Out: 8:35:17 AM Scope Withdrawal Time: 0 hours 13 minutes 23 seconds  Total Procedure Duration: 0 hours 19 minutes 10 seconds  Findings:                 The perianal and digital rectal examinations were                            normal.                           Multiple diverticula were found in the left colon.                           Two sessile polyps were found in the distal                            transverse colon and proximal ascending colon. The                            polyps were 2 to 5 mm in size. These polyps were                            removed with a cold snare. Resection and retrieval                            were complete.                           The exam was otherwise without abnormality on                            direct and retroflexion views. Complications:            No immediate complications. Estimated Blood Loss:     Estimated blood loss was minimal. Impression:               -  Diverticulosis in the left colon.                           - Two 2 to 5 mm polyps in the distal transverse                            colon and in the proximal ascending colon, removed                            with a cold snare. Resected and retrieved.                           - The examination was otherwise normal on direct                            and retroflexion views. Recommendation:           - Patient has a contact number available for  emergencies. The signs and symptoms of potential                            delayed complications were discussed with the                            patient. Return to normal activities tomorrow.                            Written discharge instructions were provided to the                            patient.                           - Resume previous diet.                           - Continue present medications.                           - Await pathology results.                           - Repeat colonoscopy is recommended for                            surveillance. The colonoscopy date will be                            determined after pathology results from today's                            exam become available for review. Henry L. Loletha Carrow, MD 03/03/2018 8:40:09 AM This report has been signed electronically.

## 2018-03-03 NOTE — Patient Instructions (Signed)
Handouts given:  Polyps and Diverticula  YOU HAD AN ENDOSCOPIC PROCEDURE TODAY AT [facility]:   Refer to the procedure report that was given to you for any specific questions about what was found during the examination.  If the procedure report does not answer your questions, please call your gastroenterologist to clarify.  If you requested that your care partner not be given the details of your procedure findings, then the procedure report has been included in a sealed envelope for you to review at your convenience later.  YOU SHOULD EXPECT: Some feelings of bloating in the abdomen. Passage of more gas than usual.  Walking can help get rid of the air that was put into your GI tract during the procedure and reduce the bloating. If you had a lower endoscopy (such as a colonoscopy or flexible sigmoidoscopy) you may notice spotting of blood in your stool or on the toilet paper. If you underwent a bowel prep for your procedure, you may not have a normal bowel movement for a few days.  Please Note:  You might notice some irritation and congestion in your nose or some drainage.  This is from the oxygen used during your procedure.  There is no need for concern and it should clear up in a day or so.  SYMPTOMS TO REPORT IMMEDIATELY:   Following lower endoscopy (colonoscopy or flexible sigmoidoscopy):  Excessive amounts of blood in the stool  Significant tenderness or worsening of abdominal pains  Swelling of the abdomen that is new, acute  Fever of 100F or higher   For urgent or emergent issues, a gastroenterologist can be reached at any hour by calling [phone].   DIET:  We do recommend a small meal at first, but then you may proceed to your regular diet.  Drink plenty of fluids but you should avoid alcoholic beverages for 24 hours.  ACTIVITY:  You should plan to take it easy for the rest of today and you should NOT DRIVE or use heavy machinery until tomorrow (because of the  sedation medicines used during the test).    FOLLOW UP: Our staff will call the number listed on your records the next business day following your procedure to check on you and address any questions or concerns that you may have regarding the information given to you following your procedure. If we do not reach you, we will leave a message.  However, if you are feeling well and you are not experiencing any problems, there is no need to return our call.  We will assume that you have returned to your regular daily activities without incident.  If any biopsies were taken you will be contacted by phone or by letter within the next 1-3 weeks.  Please call us at [phone] if you have not heard about the biopsies in 3 weeks.    SIGNATURES/CONFIDENTIALITY: You and/or your care partner have signed paperwork which will be entered into your electronic medical record.  These signatures attest to the fact that that the information above on your After Visit Summary has been reviewed and is understood.  Full responsibility of the confidentiality of this discharge information lies with you and/or your care-partner.

## 2018-03-03 NOTE — Progress Notes (Signed)
Called to room to assist during endoscopic procedure.  Patient ID and intended procedure confirmed with present staff. Received instructions for my participation in the procedure from the performing physician.  

## 2018-03-03 NOTE — Progress Notes (Signed)
Report to PACU, RN, vss, BBS= Clear.  

## 2018-03-04 ENCOUNTER — Telehealth: Payer: Self-pay

## 2018-03-04 NOTE — Telephone Encounter (Signed)
  Follow up Call-  Call back number 03/03/2018  Post procedure Call Back phone  # [phone]  Permission to leave phone message Yes  Some recent data might be hidden     Patient questions:  Do you have a fever, pain , or abdominal swelling? No. Pain Score  0 *  Have you tolerated food without any problems? Yes.    Have you been able to return to your normal activities? Yes.    Do you have any questions about your discharge instructions: Diet   No. Medications  No. Follow up visit  No.  Do you have questions or concerns about your Care? No.  Actions: * If pain score is 4 or above: No action needed, pain <4.

## 2018-03-04 NOTE — Telephone Encounter (Signed)
  Follow up Call-  Call back number 03/03/2018  Post procedure Call Back phone  # [phone]  Permission to leave phone message Yes  Some recent data might be hidden     Left message

## 2018-03-06 ENCOUNTER — Encounter: Payer: Self-pay | Admitting: Gastroenterology

## 2018-03-20 DIAGNOSIS — I472 Ventricular tachycardia: Secondary | ICD-10-CM | POA: Diagnosis not present

## 2018-04-30 DIAGNOSIS — Z947 Corneal transplant status: Secondary | ICD-10-CM | POA: Diagnosis not present

## 2018-08-20 DIAGNOSIS — I10 Essential (primary) hypertension: Secondary | ICD-10-CM | POA: Diagnosis not present

## 2018-08-20 DIAGNOSIS — M2550 Pain in unspecified joint: Secondary | ICD-10-CM | POA: Diagnosis not present

## 2018-08-20 DIAGNOSIS — J45998 Other asthma: Secondary | ICD-10-CM | POA: Diagnosis not present

## 2018-08-20 DIAGNOSIS — R5383 Other fatigue: Secondary | ICD-10-CM | POA: Diagnosis not present

## 2019-11-17 DIAGNOSIS — E1151 Type 2 diabetes mellitus with diabetic peripheral angiopathy without gangrene: Secondary | ICD-10-CM | POA: Diagnosis not present

## 2019-11-17 DIAGNOSIS — Z79899 Other long term (current) drug therapy: Secondary | ICD-10-CM | POA: Diagnosis not present

## 2019-11-17 DIAGNOSIS — Z0001 Encounter for general adult medical examination with abnormal findings: Secondary | ICD-10-CM | POA: Diagnosis not present

## 2019-11-17 DIAGNOSIS — Z008 Encounter for other general examination: Secondary | ICD-10-CM | POA: Diagnosis not present

## 2020-01-22 DIAGNOSIS — H18232 Secondary corneal edema, left eye: Secondary | ICD-10-CM | POA: Diagnosis not present

## 2020-01-23 ENCOUNTER — Ambulatory Visit: Payer: PPO | Attending: Internal Medicine

## 2020-01-23 DIAGNOSIS — Z23 Encounter for immunization: Secondary | ICD-10-CM

## 2020-01-23 NOTE — Progress Notes (Signed)
   Covid-19 Vaccination Clinic  Name:  Alex Myers    MRN: [id number] DOB: 1946/03/22  01/23/2020  Mr. Proietti was observed post Covid-19 immunization for 15 minutes without incident. He was provided with Vaccine Information Sheet and instruction to access the V-Safe system.   Mr. Bollenbach was instructed to call 911 with any severe reactions post vaccine: Marland Kitchen Difficulty breathing  . Swelling of face and throat  . A fast heartbeat  . A bad rash all over body  . Dizziness and weakness   Immunizations Administered    Name Date Dose VIS Date Route   Pfizer COVID-19 Vaccine 01/23/2020  1:23 PM 0.3 mL 11/11/2018 Intramuscular   Manufacturer: [employer], [employer]   Lot: [id number]   [place]: [id number]

## 2020-02-09 DIAGNOSIS — H4032X4 Glaucoma secondary to eye trauma, left eye, indeterminate stage: Secondary | ICD-10-CM | POA: Diagnosis not present

## 2020-02-12 DIAGNOSIS — H18232 Secondary corneal edema, left eye: Secondary | ICD-10-CM | POA: Diagnosis not present

## 2020-02-16 ENCOUNTER — Ambulatory Visit: Payer: PPO | Attending: Internal Medicine

## 2020-02-16 DIAGNOSIS — Z23 Encounter for immunization: Secondary | ICD-10-CM

## 2020-02-16 NOTE — Progress Notes (Signed)
   Covid-19 Vaccination Clinic  Name:  Alex Myers    MRN: [id number] DOB: Feb 02, 1946  02/16/2020  Alex Myers was observed post Covid-19 immunization for 15 minutes without incident. He was provided with Vaccine Information Sheet and instruction to access the V-Safe system.   Alex Myers was instructed to call 911 with any severe reactions post vaccine: Marland Kitchen Difficulty breathing  . Swelling of face and throat  . A fast heartbeat  . A bad rash all over body  . Dizziness and weakness   Immunizations Administered    Name Date Dose VIS Date Route   Pfizer COVID-19 Vaccine 02/16/2020 12:47 PM 0.3 mL 11/11/2018 Intramuscular   Manufacturer: [place]   Lot: [id number]   [place]: [id number]

## 2020-02-22 DIAGNOSIS — H903 Sensorineural hearing loss, bilateral: Secondary | ICD-10-CM | POA: Diagnosis not present

## 2020-02-22 DIAGNOSIS — H6123 Impacted cerumen, bilateral: Secondary | ICD-10-CM | POA: Diagnosis not present

## 2020-02-22 DIAGNOSIS — H7201 Central perforation of tympanic membrane, right ear: Secondary | ICD-10-CM | POA: Diagnosis not present

## 2020-02-22 DIAGNOSIS — H6983 Other specified disorders of Eustachian tube, bilateral: Secondary | ICD-10-CM | POA: Diagnosis not present

## 2020-03-03 DIAGNOSIS — T85698A Other mechanical complication of other specified internal prosthetic devices, implants and grafts, initial encounter: Secondary | ICD-10-CM | POA: Diagnosis not present

## 2020-03-03 DIAGNOSIS — T85898A Other specified complication of other internal prosthetic devices, implants and grafts, initial encounter: Secondary | ICD-10-CM | POA: Diagnosis not present

## 2020-03-03 DIAGNOSIS — Z947 Corneal transplant status: Secondary | ICD-10-CM | POA: Diagnosis not present

## 2020-03-03 DIAGNOSIS — Z7984 Long term (current) use of oral hypoglycemic drugs: Secondary | ICD-10-CM | POA: Diagnosis not present

## 2020-03-03 DIAGNOSIS — I10 Essential (primary) hypertension: Secondary | ICD-10-CM | POA: Diagnosis not present

## 2020-03-03 DIAGNOSIS — H5702 Anisocoria: Secondary | ICD-10-CM | POA: Diagnosis not present

## 2020-03-03 DIAGNOSIS — E119 Type 2 diabetes mellitus without complications: Secondary | ICD-10-CM | POA: Diagnosis not present

## 2020-03-03 DIAGNOSIS — H4032X4 Glaucoma secondary to eye trauma, left eye, indeterminate stage: Secondary | ICD-10-CM | POA: Diagnosis not present

## 2020-03-03 DIAGNOSIS — Z720 Tobacco use: Secondary | ICD-10-CM | POA: Diagnosis not present

## 2020-04-15 DIAGNOSIS — H18232 Secondary corneal edema, left eye: Secondary | ICD-10-CM | POA: Diagnosis not present

## 2020-06-22 DIAGNOSIS — E1151 Type 2 diabetes mellitus with diabetic peripheral angiopathy without gangrene: Secondary | ICD-10-CM | POA: Diagnosis not present

## 2020-06-22 DIAGNOSIS — E785 Hyperlipidemia, unspecified: Secondary | ICD-10-CM | POA: Diagnosis not present

## 2020-06-22 DIAGNOSIS — Z0001 Encounter for general adult medical examination with abnormal findings: Secondary | ICD-10-CM | POA: Diagnosis not present

## 2020-06-22 DIAGNOSIS — Z79899 Other long term (current) drug therapy: Secondary | ICD-10-CM | POA: Diagnosis not present

## 2020-07-01 DIAGNOSIS — H4032X4 Glaucoma secondary to eye trauma, left eye, indeterminate stage: Secondary | ICD-10-CM | POA: Diagnosis not present

## 2020-07-01 DIAGNOSIS — H5712 Ocular pain, left eye: Secondary | ICD-10-CM | POA: Diagnosis not present

## 2020-07-01 DIAGNOSIS — H5442A5 Blindness left eye category 5, normal vision right eye: Secondary | ICD-10-CM | POA: Diagnosis not present

## 2020-07-08 DIAGNOSIS — N1831 Chronic kidney disease, stage 3a: Secondary | ICD-10-CM | POA: Diagnosis not present

## 2020-07-08 DIAGNOSIS — M79673 Pain in unspecified foot: Secondary | ICD-10-CM | POA: Diagnosis not present

## 2020-07-08 DIAGNOSIS — I10 Essential (primary) hypertension: Secondary | ICD-10-CM | POA: Diagnosis not present

## 2020-07-10 DIAGNOSIS — Z20822 Contact with and (suspected) exposure to covid-19: Secondary | ICD-10-CM | POA: Diagnosis not present

## 2020-07-13 DIAGNOSIS — E119 Type 2 diabetes mellitus without complications: Secondary | ICD-10-CM | POA: Diagnosis not present

## 2020-07-13 DIAGNOSIS — I10 Essential (primary) hypertension: Secondary | ICD-10-CM | POA: Diagnosis not present

## 2020-07-13 DIAGNOSIS — Z947 Corneal transplant status: Secondary | ICD-10-CM | POA: Diagnosis not present

## 2020-07-13 DIAGNOSIS — H5712 Ocular pain, left eye: Secondary | ICD-10-CM | POA: Diagnosis not present

## 2020-07-13 DIAGNOSIS — H4032X4 Glaucoma secondary to eye trauma, left eye, indeterminate stage: Secondary | ICD-10-CM | POA: Diagnosis not present

## 2020-07-13 DIAGNOSIS — Z7984 Long term (current) use of oral hypoglycemic drugs: Secondary | ICD-10-CM | POA: Diagnosis not present

## 2020-07-13 DIAGNOSIS — H5440 Blindness, one eye, unspecified eye: Secondary | ICD-10-CM | POA: Diagnosis not present

## 2020-07-13 DIAGNOSIS — H5442A3 Blindness left eye category 3, normal vision right eye: Secondary | ICD-10-CM | POA: Diagnosis not present

## 2020-07-13 DIAGNOSIS — H179 Unspecified corneal scar and opacity: Secondary | ICD-10-CM | POA: Diagnosis not present

## 2020-07-13 DIAGNOSIS — Z720 Tobacco use: Secondary | ICD-10-CM | POA: Diagnosis not present

## 2020-07-22 ENCOUNTER — Ambulatory Visit (INDEPENDENT_AMBULATORY_CARE_PROVIDER_SITE_OTHER): Payer: Medicare Other

## 2020-07-22 ENCOUNTER — Other Ambulatory Visit: Payer: Self-pay

## 2020-07-22 ENCOUNTER — Ambulatory Visit: Payer: Medicare Other | Admitting: Podiatry

## 2020-07-22 DIAGNOSIS — M722 Plantar fascial fibromatosis: Secondary | ICD-10-CM

## 2020-07-22 DIAGNOSIS — M79672 Pain in left foot: Secondary | ICD-10-CM

## 2020-07-26 ENCOUNTER — Encounter: Payer: Self-pay | Admitting: Podiatry

## 2020-07-26 NOTE — Progress Notes (Signed)
Subjective:  Patient ID: Alex Myers, male    DOB: 01-02-1946,  MRN: [id number]  Chief Complaint  Patient presents with  . Foot Pain    Left foot pain mainly at the heel. PT stated that he feels like he is walking on nail or something     74 y.o. male presents with the above complaint.  Patient presents with complaint of left heel pain that has been going on for quite some time.  Patient states she has tried icing it heating and nothing has helped.  Patient states it feels like what he is walking on nail constantly.  He experienced post static dyskinesia type of symptoms.  He has not seen anyone else prior to seeing me for this particular issue.  He denies any other treatment options he denies any other acute complaints.   Review of Systems: Negative except as noted in the HPI. Denies N/V/F/Ch.  Past Medical History:  Diagnosis Date  . DM (diabetes mellitus) ([place]) 2011   DIET CONTROL  . HTN (hypertension)   . Hypercholesterolemia     Current Outpatient Medications:  .  erythromycin ophthalmic ointment, Apply a small amount into the left eye socket three times a day after patch is removed., Disp: , Rfl:  .  amLODipine (NORVASC) 10 MG tablet, Take 1 tablet (10 mg total) by mouth daily., Disp: , Rfl:  .  Ascorbic Acid (VITAMIN C PO), Take 1 tablet by mouth daily., Disp: , Rfl:  .  atorvastatin (LIPITOR) 10 MG tablet, Take 10 mg by mouth daily., Disp: , Rfl:  .  brimonidine (ALPHAGAN) 0.2 % ophthalmic solution, SMARTSIG:In Eye(s), Disp: , Rfl:  .  CALCIUM PO, Take 1 tablet by mouth daily., Disp: , Rfl:  .  cephALEXin (KEFLEX) 500 MG capsule, Take 500 mg by mouth 2 (two) times daily., Disp: , Rfl:  .  dorzolamide-timolol (COSOPT) 22.3-6.8 MG/ML ophthalmic solution, Place 1 drop into the left eye 2 (two) times daily., Disp: , Rfl:  .  ibuprofen (ADVIL) 800 MG tablet, Take 800 mg by mouth 3 (three) times daily., Disp: , Rfl:  .  latanoprost (XALATAN) 0.005 % ophthalmic solution,  SMARTSIG:In Eye(s), Disp: , Rfl:  .  lisinopril (PRINIVIL,ZESTRIL) 10 MG tablet, Take 1 tablet (10 mg total) by mouth daily., Disp: , Rfl:  .  lisinopril (ZESTRIL) 30 MG tablet, Take 30 mg by mouth daily., Disp: , Rfl:  .  lisinopril-hydrochlorothiazide (ZESTORETIC) 20-12.5 MG tablet, Take 1 tablet by mouth daily., Disp: , Rfl:  .  moxifloxacin (VIGAMOX) 0.5 % ophthalmic solution, Apply to eye., Disp: , Rfl:  .  Multiple Vitamin (MULTIVITAMIN) tablet, Take 1 tablet by mouth daily., Disp: , Rfl:  .  oxyCODONE (OXY IR/ROXICODONE) 5 MG immediate release tablet, Take 5 mg by mouth 4 (four) times daily as needed., Disp: , Rfl:  .  prednisoLONE acetate (PRED FORTE) 1 % ophthalmic suspension, Place 1 drop into the left eye daily., Disp: , Rfl:  .  TURMERIC PO, Take 1 tablet by mouth daily., Disp: , Rfl:  .  VITAMIN E PO, Take 1 tablet by mouth daily., Disp: , Rfl:   Current Facility-Administered Medications:  .  0.9 %  sodium chloride infusion, 500 mL, Intravenous, Once, Danis, Kirke Corin, MD  Social History   Tobacco Use  Smoking Status Current Some Day Smoker  . Types: Cigars  Smokeless Tobacco Never Used    No Known Allergies Objective:  There were no vitals filed for this visit. There is  no height or weight on file to calculate BMI. Constitutional Well developed. Well nourished.  Vascular Dorsalis pedis pulses palpable bilaterally. Posterior tibial pulses palpable bilaterally. Capillary refill normal to all digits.  No cyanosis or clubbing noted. Pedal hair growth normal.  Neurologic Normal speech. Oriented to person, place, and time. Epicritic sensation to light touch grossly present bilaterally.  Dermatologic Nails well groomed and normal in appearance. No open wounds. No skin lesions.  Orthopedic: Normal joint ROM without pain or crepitus bilaterally. No visible deformities. Tender to palpation at the calcaneal tuber left. No pain with calcaneal squeeze left. Ankle ROM  diminished range of motion left. Silfverskiold Test: positive left.   Radiographs: Taken and reviewed. No acute fractures or dislocations. No evidence of stress fracture.  Plantar heel spur present. Posterior heel spur absent.   Assessment:   1. Plantar fasciitis of left foot    Plan:  Patient was evaluated and treated and all questions answered.  Plantar Fasciitis, left - XR reviewed as above.  - Educated on icing and stretching. Instructions given.  - Injection delivered to the plantar fascia as below. - DME: Plantar Fascial Brace - Pharmacologic management: None  Procedure: Injection Tendon/Ligament Location: Left plantar fascia at the glabrous junction; medial approach. Skin Prep: alcohol Injectate: 0.5 cc 0.5% marcaine plain, 0.5 cc of 1% Lidocaine, 0.5 cc kenalog 10. Disposition: Patient tolerated procedure well. Injection site dressed with a band-aid.  No follow-ups on file.

## 2020-08-16 DIAGNOSIS — H6983 Other specified disorders of Eustachian tube, bilateral: Secondary | ICD-10-CM | POA: Diagnosis not present

## 2020-08-16 DIAGNOSIS — H6123 Impacted cerumen, bilateral: Secondary | ICD-10-CM | POA: Diagnosis not present

## 2020-08-16 DIAGNOSIS — H7201 Central perforation of tympanic membrane, right ear: Secondary | ICD-10-CM | POA: Diagnosis not present

## 2020-08-19 ENCOUNTER — Other Ambulatory Visit: Payer: Self-pay

## 2020-08-19 ENCOUNTER — Ambulatory Visit: Payer: Medicare Other | Admitting: Podiatry

## 2020-08-19 DIAGNOSIS — M722 Plantar fascial fibromatosis: Secondary | ICD-10-CM | POA: Diagnosis not present

## 2020-08-19 DIAGNOSIS — Q666 Other congenital valgus deformities of feet: Secondary | ICD-10-CM | POA: Diagnosis not present

## 2020-08-23 ENCOUNTER — Encounter: Payer: Self-pay | Admitting: Podiatry

## 2020-08-23 NOTE — Progress Notes (Signed)
Subjective:  Patient ID: Alex Myers, male    DOB: 1946/07/28,  MRN: [id number]  Chief Complaint  Patient presents with  . Plantar Fasciitis    Recurring pain- injectioned relief pain up until wednesday of this week - mentioned hewas back doing his normal activies- but flare up occured     74 y.o. male presents with the above complaint.  Patient presents with follow-up of left plantar fasciitis.  Patient states that he is doing great and doing better back to normal activities.  He states the injection helped however pain to his pain came back.  He did get get a good amount of relief it was about 80 to 90% but now has recurred and went back down to 50 to 60%.  He denies any other acute complaints.   Review of Systems: Negative except as noted in the HPI. Denies N/V/F/Ch.  Past Medical History:  Diagnosis Date  . DM (diabetes mellitus) ([place]) 2011   DIET CONTROL  . HTN (hypertension)   . Hypercholesterolemia     Current Outpatient Medications:  .  amLODipine (NORVASC) 10 MG tablet, Take 1 tablet (10 mg total) by mouth daily., Disp: , Rfl:  .  Ascorbic Acid (VITAMIN C PO), Take 1 tablet by mouth daily., Disp: , Rfl:  .  atorvastatin (LIPITOR) 10 MG tablet, Take 10 mg by mouth daily., Disp: , Rfl:  .  brimonidine (ALPHAGAN) 0.2 % ophthalmic solution, SMARTSIG:In Eye(s), Disp: , Rfl:  .  CALCIUM PO, Take 1 tablet by mouth daily., Disp: , Rfl:  .  cephALEXin (KEFLEX) 500 MG capsule, Take 500 mg by mouth 2 (two) times daily., Disp: , Rfl:  .  dorzolamide-timolol (COSOPT) 22.3-6.8 MG/ML ophthalmic solution, Place 1 drop into the left eye 2 (two) times daily., Disp: , Rfl:  .  erythromycin ophthalmic ointment, Apply a small amount into the left eye socket three times a day after patch is removed., Disp: , Rfl:  .  ibuprofen (ADVIL) 800 MG tablet, Take 800 mg by mouth 3 (three) times daily., Disp: , Rfl:  .  latanoprost (XALATAN) 0.005 % ophthalmic solution, SMARTSIG:In Eye(s), Disp: , Rfl:   .  lisinopril (PRINIVIL,ZESTRIL) 10 MG tablet, Take 1 tablet (10 mg total) by mouth daily., Disp: , Rfl:  .  lisinopril (ZESTRIL) 30 MG tablet, Take 30 mg by mouth daily., Disp: , Rfl:  .  lisinopril-hydrochlorothiazide (ZESTORETIC) 20-12.5 MG tablet, Take 1 tablet by mouth daily., Disp: , Rfl:  .  moxifloxacin (VIGAMOX) 0.5 % ophthalmic solution, Apply to eye., Disp: , Rfl:  .  Multiple Vitamin (MULTIVITAMIN) tablet, Take 1 tablet by mouth daily., Disp: , Rfl:  .  oxyCODONE (OXY IR/ROXICODONE) 5 MG immediate release tablet, Take 5 mg by mouth 4 (four) times daily as needed., Disp: , Rfl:  .  prednisoLONE acetate (PRED FORTE) 1 % ophthalmic suspension, Place 1 drop into the left eye daily., Disp: , Rfl:  .  TURMERIC PO, Take 1 tablet by mouth daily., Disp: , Rfl:  .  VITAMIN E PO, Take 1 tablet by mouth daily., Disp: , Rfl:   Current Facility-Administered Medications:  .  0.9 %  sodium chloride infusion, 500 mL, Intravenous, Once, Danis, Kirke Corin, MD  Social History   Tobacco Use  Smoking Status Current Some Day Smoker  . Types: Cigars  Smokeless Tobacco Never Used    No Known Allergies Objective:  There were no vitals filed for this visit. There is no height or weight on file  to calculate BMI. Constitutional Well developed. Well nourished.  Vascular Dorsalis pedis pulses palpable bilaterally. Posterior tibial pulses palpable bilaterally. Capillary refill normal to all digits.  No cyanosis or clubbing noted. Pedal hair growth normal.  Neurologic Normal speech. Oriented to person, place, and time. Epicritic sensation to light touch grossly present bilaterally.  Dermatologic Nails well groomed and normal in appearance. No open wounds. No skin lesions.  Orthopedic: Normal joint ROM without pain or crepitus bilaterally. No visible deformities. Tender to palpation at the calcaneal tuber left. No pain with calcaneal squeeze left. Ankle ROM diminished range of motion  left. Silfverskiold Test: positive left.   Radiographs: Taken and reviewed. No acute fractures or dislocations. No evidence of stress fracture.  Plantar heel spur present. Posterior heel spur absent.   Assessment:   1. Pes planovalgus   2. Plantar fasciitis of left foot    Plan:  Patient was evaluated and treated and all questions answered.  Plantar Fasciitis, left - XR reviewed as above.  - Educated on icing and stretching. Instructions given.  -Second injection delivered to the plantar fascia as below. - DME: Continue plantar Fascial Brace - Pharmacologic management: None  Pes planovalgus -I explained the patient the etiology of pes planovalgus versus treatment options were discussed.  I believe patient benefit from custom-made orthotics to help control the hindfoot motion and support the arch of the foot and take the stress away from plantar fascia.  Patient states understanding would like to obtain orthotics.  Procedure: Injection Tendon/Ligament Location: Left plantar fascia at the glabrous junction; medial approach. Skin Prep: alcohol Injectate: 0.5 cc 0.5% marcaine plain, 0.5 cc of 1% Lidocaine, 0.5 cc kenalog 10. Disposition: Patient tolerated procedure well. Injection site dressed with a band-aid.  No follow-ups on file.

## 2020-08-30 DIAGNOSIS — H40051 Ocular hypertension, right eye: Secondary | ICD-10-CM | POA: Diagnosis not present

## 2020-09-06 ENCOUNTER — Other Ambulatory Visit: Payer: Self-pay

## 2020-09-06 ENCOUNTER — Ambulatory Visit (INDEPENDENT_AMBULATORY_CARE_PROVIDER_SITE_OTHER): Payer: Medicare Other | Admitting: Orthotics

## 2020-09-06 DIAGNOSIS — Q666 Other congenital valgus deformities of feet: Secondary | ICD-10-CM

## 2020-09-06 DIAGNOSIS — M722 Plantar fascial fibromatosis: Secondary | ICD-10-CM

## 2020-09-06 NOTE — Progress Notes (Signed)
Patient is being seen today for f/o to address congential pes planus/pes planovalgus. Patient is active youth and demonstrates over pronation in gait, prominent medially shifted talus, and collapse of medial column.  Goals are RF stability, longitudinal arch support, decrease in pronation, and ease of discomfort in mobility related activities.    Signed MCR ABN

## 2020-09-14 ENCOUNTER — Ambulatory Visit: Payer: Medicare Other | Admitting: Podiatry

## 2020-09-14 ENCOUNTER — Encounter: Payer: Self-pay | Admitting: Podiatry

## 2020-09-14 ENCOUNTER — Other Ambulatory Visit: Payer: Self-pay

## 2020-09-14 DIAGNOSIS — M722 Plantar fascial fibromatosis: Secondary | ICD-10-CM | POA: Diagnosis not present

## 2020-09-14 DIAGNOSIS — Q666 Other congenital valgus deformities of feet: Secondary | ICD-10-CM | POA: Diagnosis not present

## 2020-09-14 NOTE — Progress Notes (Signed)
Subjective:  Patient ID: Alex Myers, male    DOB: 04-Jul-1946,  MRN: [id number]  Chief Complaint  Patient presents with  . Foot Pain    Pt stated that he is doing okay he has no pain and concerns at this time.    74 y.o. male presents with the above complaint.  Patient presents for follow-up of left plantar fasciitis.  He has been casted for orthotics.  He states is about 90% improved.  He denies any other acute complaints.  He would like to know what the future treatment plans are now.   Review of Systems: Negative except as noted in the HPI. Denies N/V/F/Ch.  Past Medical History:  Diagnosis Date  . DM (diabetes mellitus) ([facility]) 2011   DIET CONTROL  . HTN (hypertension)   . Hypercholesterolemia     Current Outpatient Medications:  .  amLODipine (NORVASC) 10 MG tablet, Take 1 tablet (10 mg total) by mouth daily., Disp: , Rfl:  .  Ascorbic Acid (VITAMIN C PO), Take 1 tablet by mouth daily., Disp: , Rfl:  .  atorvastatin (LIPITOR) 10 MG tablet, Take 10 mg by mouth daily., Disp: , Rfl:  .  brimonidine (ALPHAGAN) 0.2 % ophthalmic solution, SMARTSIG:In Eye(s), Disp: , Rfl:  .  CALCIUM PO, Take 1 tablet by mouth daily., Disp: , Rfl:  .  cephALEXin (KEFLEX) 500 MG capsule, Take 500 mg by mouth 2 (two) times daily., Disp: , Rfl:  .  dorzolamide-timolol (COSOPT) 22.3-6.8 MG/ML ophthalmic solution, Place 1 drop into the left eye 2 (two) times daily., Disp: , Rfl:  .  erythromycin ophthalmic ointment, Apply a small amount into the left eye socket three times a day after patch is removed., Disp: , Rfl:  .  ibuprofen (ADVIL) 800 MG tablet, Take 800 mg by mouth 3 (three) times daily., Disp: , Rfl:  .  latanoprost (XALATAN) 0.005 % ophthalmic solution, SMARTSIG:In Eye(s), Disp: , Rfl:  .  lisinopril (PRINIVIL,ZESTRIL) 10 MG tablet, Take 1 tablet (10 mg total) by mouth daily., Disp: , Rfl:  .  lisinopril (ZESTRIL) 30 MG tablet, Take 30 mg by mouth daily., Disp: , Rfl:  .   lisinopril-hydrochlorothiazide (ZESTORETIC) 20-12.5 MG tablet, Take 1 tablet by mouth daily., Disp: , Rfl:  .  moxifloxacin (VIGAMOX) 0.5 % ophthalmic solution, Apply to eye., Disp: , Rfl:  .  Multiple Vitamin (MULTIVITAMIN) tablet, Take 1 tablet by mouth daily., Disp: , Rfl:  .  oxyCODONE (OXY IR/ROXICODONE) 5 MG immediate release tablet, Take 5 mg by mouth 4 (four) times daily as needed., Disp: , Rfl:  .  prednisoLONE acetate (PRED FORTE) 1 % ophthalmic suspension, Place 1 drop into the left eye daily., Disp: , Rfl:  .  TURMERIC PO, Take 1 tablet by mouth daily., Disp: , Rfl:  .  VITAMIN E PO, Take 1 tablet by mouth daily., Disp: , Rfl:   Current Facility-Administered Medications:  .  0.9 %  sodium chloride infusion, 500 mL, Intravenous, Once, Danis, Andreas Blower, MD  Social History   Tobacco Use  Smoking Status Current Some Day Smoker  . Types: Cigars  Smokeless Tobacco Never Used    No Known Allergies Objective:  There were no vitals filed for this visit. There is no height or weight on file to calculate BMI. Constitutional Well developed. Well nourished.  Vascular Dorsalis pedis pulses palpable bilaterally. Posterior tibial pulses palpable bilaterally. Capillary refill normal to all digits.  No cyanosis or clubbing noted. Pedal hair growth normal.  Neurologic Normal speech. Oriented to person, place, and time. Epicritic sensation to light touch grossly present bilaterally.  Dermatologic Nails well groomed and normal in appearance. No open wounds. No skin lesions.  Orthopedic: Normal joint ROM without pain or crepitus bilaterally. No visible deformities. No Tender to palpation at the calcaneal tuber left. No pain with calcaneal squeeze left. Ankle ROM diminished range of motion left. Silfverskiold Test: positive left.   Radiographs: Taken and reviewed. No acute fractures or dislocations. No evidence of stress fracture.  Plantar heel spur present. Posterior heel spur  absent.   Assessment:   1. Pes planovalgus   2. Plantar fasciitis of left foot    Plan:  Patient was evaluated and treated and all questions answered.  Plantar Fasciitis, left Clinically healed.  I discussed with him the importance of stretching icing and orthotics management.  Patient will pick up his orthotics and I encouraged him to discontinue bracing once he has been in orthotics.  Patient states understanding will do so immediately.  I also discussed with him the importance of breaking the orthotics in.  He states understanding -If any foot and ankle issues arise in future to see him back.  Pes planovalgus -I explained the patient the etiology of pes planovalgus versus treatment options were discussed.  I believe patient benefit from custom-made orthotics to help control the hindfoot motion and support the arch of the foot and take the stress away from plantar fascia.  Patient states understanding would like to obtain orthotics. -He is awaiting orthotics as he has been casted of  No follow-ups on file.

## 2020-10-04 ENCOUNTER — Encounter: Payer: Medicare Other | Admitting: Orthotics

## 2020-10-12 ENCOUNTER — Encounter: Payer: Medicare Other | Admitting: Orthotics

## 2020-10-12 DIAGNOSIS — Z4420 Encounter for fitting and adjustment of artificial eye, unspecified: Secondary | ICD-10-CM | POA: Diagnosis not present

## 2020-10-13 ENCOUNTER — Other Ambulatory Visit: Payer: Self-pay

## 2020-10-13 ENCOUNTER — Ambulatory Visit: Payer: Medicare Other | Admitting: Orthotics

## 2020-10-13 DIAGNOSIS — M722 Plantar fascial fibromatosis: Secondary | ICD-10-CM

## 2020-10-13 DIAGNOSIS — Q666 Other congenital valgus deformities of feet: Secondary | ICD-10-CM

## 2020-10-13 NOTE — Progress Notes (Signed)
Patient came in today to pick up custom made foot orthotics.  The goals were accomplished and the patient reported no dissatisfaction with said orthotics.  Patient was advised of breakin period and how to report any issues. 

## 2020-10-19 ENCOUNTER — Other Ambulatory Visit: Payer: Self-pay

## 2020-10-19 ENCOUNTER — Ambulatory Visit (INDEPENDENT_AMBULATORY_CARE_PROVIDER_SITE_OTHER): Payer: Medicare Other

## 2020-10-19 ENCOUNTER — Ambulatory Visit: Payer: Medicare Other | Admitting: Podiatry

## 2020-10-19 DIAGNOSIS — M7732 Calcaneal spur, left foot: Secondary | ICD-10-CM

## 2020-10-19 DIAGNOSIS — M722 Plantar fascial fibromatosis: Secondary | ICD-10-CM | POA: Diagnosis not present

## 2020-10-21 ENCOUNTER — Ambulatory Visit: Payer: Medicare Other | Admitting: Podiatry

## 2020-10-21 DIAGNOSIS — H906 Mixed conductive and sensorineural hearing loss, bilateral: Secondary | ICD-10-CM | POA: Diagnosis not present

## 2020-10-21 DIAGNOSIS — H6983 Other specified disorders of Eustachian tube, bilateral: Secondary | ICD-10-CM | POA: Diagnosis not present

## 2020-10-21 DIAGNOSIS — H7201 Central perforation of tympanic membrane, right ear: Secondary | ICD-10-CM | POA: Diagnosis not present

## 2020-10-25 ENCOUNTER — Encounter: Payer: Self-pay | Admitting: Podiatry

## 2020-10-25 NOTE — Progress Notes (Signed)
Subjective:  Patient ID: Alex Myers, male    DOB: 1946-03-17,  MRN: [id number]  Chief Complaint  Patient presents with  . Foot Injury    Patient stated that last Monday he was stepping into the hot-tub and stood on the ball of his left foot, at that angle he felt like something was tearing inside of the mid-foot. Patient states it felty like a knife slicing through the center of my foot. Patient experiencing pain when walking.     75 y.o. male presents with the above complaint.  Patient presents with acute pain to the left plantar midfoot.  Patient states that he was stepping into a hot tub last Monday and he started feeling the pain it felt like it had torn a something torn.  He did not feel a pop.  He wanted to get it evaluated.  He does have history of plantar fasciitis.  He has been wearing his orthotics and wearing his bracing.  He denies any other acute complaints.  He would like to discuss treatment options he would like to know if this is plantar fasciitis reaggravated.   Review of Systems: Negative except as noted in the HPI. Denies N/V/F/Ch.  Past Medical History:  Diagnosis Date  . DM (diabetes mellitus) ([place]) 2011   DIET CONTROL  . HTN (hypertension)   . Hypercholesterolemia     Current Outpatient Medications:  .  amLODipine (NORVASC) 10 MG tablet, Take 1 tablet (10 mg total) by mouth daily., Disp: , Rfl:  .  Ascorbic Acid (VITAMIN C PO), Take 1 tablet by mouth daily., Disp: , Rfl:  .  atorvastatin (LIPITOR) 10 MG tablet, Take 10 mg by mouth daily., Disp: , Rfl:  .  brimonidine (ALPHAGAN) 0.2 % ophthalmic solution, SMARTSIG:In Eye(s), Disp: , Rfl:  .  CALCIUM PO, Take 1 tablet by mouth daily., Disp: , Rfl:  .  cephALEXin (KEFLEX) 500 MG capsule, Take 500 mg by mouth 2 (two) times daily., Disp: , Rfl:  .  dorzolamide-timolol (COSOPT) 22.3-6.8 MG/ML ophthalmic solution, Place 1 drop into the left eye 2 (two) times daily., Disp: , Rfl:  .  erythromycin ophthalmic ointment,  Apply a small amount into the left eye socket three times a day after patch is removed., Disp: , Rfl:  .  ibuprofen (ADVIL) 800 MG tablet, Take 800 mg by mouth 3 (three) times daily., Disp: , Rfl:  .  latanoprost (XALATAN) 0.005 % ophthalmic solution, SMARTSIG:In Eye(s), Disp: , Rfl:  .  lisinopril (PRINIVIL,ZESTRIL) 10 MG tablet, Take 1 tablet (10 mg total) by mouth daily., Disp: , Rfl:  .  lisinopril (ZESTRIL) 30 MG tablet, Take 30 mg by mouth daily., Disp: , Rfl:  .  lisinopril-hydrochlorothiazide (ZESTORETIC) 20-12.5 MG tablet, Take 1 tablet by mouth daily., Disp: , Rfl:  .  moxifloxacin (VIGAMOX) 0.5 % ophthalmic solution, Apply to eye., Disp: , Rfl:  .  Multiple Vitamin (MULTIVITAMIN) tablet, Take 1 tablet by mouth daily., Disp: , Rfl:  .  oxyCODONE (OXY IR/ROXICODONE) 5 MG immediate release tablet, Take 5 mg by mouth 4 (four) times daily as needed., Disp: , Rfl:  .  prednisoLONE acetate (PRED FORTE) 1 % ophthalmic suspension, Place 1 drop into the left eye daily., Disp: , Rfl:  .  TURMERIC PO, Take 1 tablet by mouth daily., Disp: , Rfl:  .  VITAMIN E PO, Take 1 tablet by mouth daily., Disp: , Rfl:   Current Facility-Administered Medications:  .  0.9 %  sodium chloride infusion, 500  mL, Intravenous, Once, Danis, Kirke Corin, MD  Social History   Tobacco Use  Smoking Status Current Some Day Smoker  . Types: Cigars  Smokeless Tobacco Never Used    No Known Allergies Objective:  There were no vitals filed for this visit. There is no height or weight on file to calculate BMI. Constitutional Well developed. Well nourished.  Vascular Dorsalis pedis pulses palpable bilaterally. Posterior tibial pulses palpable bilaterally. Capillary refill normal to all digits.  No cyanosis or clubbing noted. Pedal hair growth normal.  Neurologic Normal speech. Oriented to person, place, and time. Epicritic sensation to light touch grossly present bilaterally.  Dermatologic Nails well groomed and  normal in appearance. No open wounds. No skin lesions.  Orthopedic:  Pain on palpation to the left plantar midfoot component of the plantar fascia.  No pain at the heel.  No pain at the calcaneal tuber.  Pain with dorsiflexion of the digits with.  Taut plantar fascia noted with dorsiflexion of the digits.  Positive Silfverskiold test.   Radiographs: 3 views of skeletally mature adult left foot: No fractures noted. Severe pes planovalgus deformity noted.  Poor plantar heel spurring noted.  Midfoot arthritis noted.  No other bony abnormalities identified.  Assessment:   1. Plantar fasciitis of left foot   2. Heel spur, left    Plan:  Patient was evaluated and treated and all questions answered.  Left plantar fasciitis of the midfoot band with underlying heel spur -I explained the patient the etiology of her pain that he is experiencing adverse treatment options were discussed.  Given that he had a traumatic event could likely lead to partial tear of the plantar fascia.  I believe he will benefit from a steroid injection to help decrease acute inflammatory component associated pain.  Patient agrees with the plan like to proceed with a steroid injection to the midfoot of the plantar fascia. -A steroid injection was performed at left plantar midfoot using 1% plain Lidocaine and 10 mg of Kenalog. This was well tolerated. -I instructed him to go back into the plantar fascial brace. -Continue wearing shoes with orthotics.   No follow-ups on file.

## 2020-10-26 ENCOUNTER — Other Ambulatory Visit: Payer: Self-pay

## 2020-10-26 ENCOUNTER — Ambulatory Visit: Payer: Medicare Other | Admitting: Podiatry

## 2020-10-26 DIAGNOSIS — M722 Plantar fascial fibromatosis: Secondary | ICD-10-CM | POA: Diagnosis not present

## 2020-10-26 DIAGNOSIS — M7732 Calcaneal spur, left foot: Secondary | ICD-10-CM

## 2020-10-27 ENCOUNTER — Encounter: Payer: Self-pay | Admitting: Podiatry

## 2020-10-27 NOTE — Progress Notes (Signed)
Subjective:  Patient ID: Alex Myers, male    DOB: [birth year],  MRN: [id number]  Chief Complaint  Patient presents with  . Plantar Fasciitis    PT stated that the brace is helping but he is still having some pain and the pain is mainly in the heel of his left foot     75 y.o. male presents with the above complaint.  Patient presents with follow-up of left plantar midfoot plantar fasciitis.  Patient states is doing a lot better from the midfoot standpoint.  However the left plantar heel has started hurting again.  He would like to discuss treatment options for this.  He has been wearing his braces which does help.  He is also made the shoe gear modification as well   Review of Systems: Negative except as noted in the HPI. Denies N/V/F/Ch.  Past Medical History:  Diagnosis Date  . DM (diabetes mellitus) ([place]) 2011   DIET CONTROL  . HTN (hypertension)   . Hypercholesterolemia     Current Outpatient Medications:  .  amLODipine (NORVASC) 10 MG tablet, Take 1 tablet (10 mg total) by mouth daily., Disp: , Rfl:  .  Ascorbic Acid (VITAMIN C PO), Take 1 tablet by mouth daily., Disp: , Rfl:  .  atorvastatin (LIPITOR) 10 MG tablet, Take 10 mg by mouth daily., Disp: , Rfl:  .  brimonidine (ALPHAGAN) 0.2 % ophthalmic solution, SMARTSIG:In Eye(s), Disp: , Rfl:  .  CALCIUM PO, Take 1 tablet by mouth daily., Disp: , Rfl:  .  cephALEXin (KEFLEX) 500 MG capsule, Take 500 mg by mouth 2 (two) times daily., Disp: , Rfl:  .  dorzolamide-timolol (COSOPT) 22.3-6.8 MG/ML ophthalmic solution, Place 1 drop into the left eye 2 (two) times daily., Disp: , Rfl:  .  erythromycin ophthalmic ointment, Apply a small amount into the left eye socket three times a day after patch is removed., Disp: , Rfl:  .  ibuprofen (ADVIL) 800 MG tablet, Take 800 mg by mouth 3 (three) times daily., Disp: , Rfl:  .  latanoprost (XALATAN) 0.005 % ophthalmic solution, SMARTSIG:In Eye(s), Disp: , Rfl:  .  lisinopril (PRINIVIL,ZESTRIL)  10 MG tablet, Take 1 tablet (10 mg total) by mouth daily., Disp: , Rfl:  .  lisinopril (ZESTRIL) 30 MG tablet, Take 30 mg by mouth daily., Disp: , Rfl:  .  lisinopril-hydrochlorothiazide (ZESTORETIC) 20-12.5 MG tablet, Take 1 tablet by mouth daily., Disp: , Rfl:  .  moxifloxacin (VIGAMOX) 0.5 % ophthalmic solution, Apply to eye., Disp: , Rfl:  .  Multiple Vitamin (MULTIVITAMIN) tablet, Take 1 tablet by mouth daily., Disp: , Rfl:  .  oxyCODONE (OXY IR/ROXICODONE) 5 MG immediate release tablet, Take 5 mg by mouth 4 (four) times daily as needed., Disp: , Rfl:  .  prednisoLONE acetate (PRED FORTE) 1 % ophthalmic suspension, Place 1 drop into the left eye daily., Disp: , Rfl:  .  TURMERIC PO, Take 1 tablet by mouth daily., Disp: , Rfl:  .  VITAMIN E PO, Take 1 tablet by mouth daily., Disp: , Rfl:   Current Facility-Administered Medications:  .  0.9 %  sodium chloride infusion, 500 mL, Intravenous, Once, Danis, Kirke Corin, MD  Social History   Tobacco Use  Smoking Status Current Some Day Smoker  . Types: Cigars  Smokeless Tobacco Never Used    No Known Allergies Objective:  There were no vitals filed for this visit. There is no height or weight on file to calculate BMI. Constitutional Well  developed. Well nourished.  Vascular Dorsalis pedis pulses palpable bilaterally. Posterior tibial pulses palpable bilaterally. Capillary refill normal to all digits.  No cyanosis or clubbing noted. Pedal hair growth normal.  Neurologic Normal speech. Oriented to person, place, and time. Epicritic sensation to light touch grossly present bilaterally.  Dermatologic Nails well groomed and normal in appearance. No open wounds. No skin lesions.  Orthopedic:  No pain on palpation to the left plantar midfoot component of the plantar fascia.  Pain on palpation to the left calcaneal tuber medial heel.  pain at the calcaneal tuber.  Pain with dorsiflexion of the digits with.  Taut plantar fascia noted with  dorsiflexion of the digits.  Positive Silfverskiold test.   Radiographs: 3 views of skeletally mature adult left foot: No fractures noted. Severe pes planovalgus deformity noted.  Poor plantar heel spurring noted.  Midfoot arthritis noted.  No other bony abnormalities identified.  Assessment:   1. Plantar fasciitis of left foot   2. Heel spur, left    Plan:  Patient was evaluated and treated and all questions answered.  Left plantar fasciitis now at the calcaneal tuber underlying heel spur -I explained the patient the etiology of her pain that he is experiencing adverse treatment options were discussed.  Given that he had a traumatic event could likely lead to partial tear of the plantar fascia.  I believe he will benefit from a steroid injection to help decrease acute inflammatory component associated pain.  Patient agrees with the plan like to proceed with a steroid injection to the midfoot of the plantar fascia. -A second steroid injection was performed at left plantar heel using 1% plain Lidocaine and 10 mg of Kenalog. This was well tolerated. -I instructed him to go back into the plantar fascial brace. -Continue wearing shoes with orthotics.   No follow-ups on file.

## 2020-10-28 ENCOUNTER — Telehealth: Payer: Self-pay | Admitting: Podiatry

## 2020-10-28 NOTE — Telephone Encounter (Signed)
Steroid can start sometimes takes 5 to 7 days for it to kick in.  If he continues to have pain he can come back and see me and we may need to order an MRI

## 2020-10-28 NOTE — Telephone Encounter (Signed)
Patient stated he is still experiencing heel pain-left foot, although he received injection on Wednesday, Please Advise

## 2020-11-16 ENCOUNTER — Ambulatory Visit: Payer: Medicare Other | Admitting: Podiatry

## 2020-11-22 DIAGNOSIS — H6121 Impacted cerumen, right ear: Secondary | ICD-10-CM | POA: Diagnosis not present

## 2020-11-22 DIAGNOSIS — H906 Mixed conductive and sensorineural hearing loss, bilateral: Secondary | ICD-10-CM | POA: Diagnosis not present

## 2020-11-22 DIAGNOSIS — H6983 Other specified disorders of Eustachian tube, bilateral: Secondary | ICD-10-CM | POA: Diagnosis not present

## 2020-11-22 DIAGNOSIS — H7201 Central perforation of tympanic membrane, right ear: Secondary | ICD-10-CM | POA: Diagnosis not present

## 2020-11-25 ENCOUNTER — Ambulatory Visit: Payer: Medicare Other | Admitting: Podiatry

## 2020-11-25 ENCOUNTER — Other Ambulatory Visit: Payer: Self-pay

## 2020-11-25 DIAGNOSIS — M722 Plantar fascial fibromatosis: Secondary | ICD-10-CM

## 2020-11-25 DIAGNOSIS — M7732 Calcaneal spur, left foot: Secondary | ICD-10-CM

## 2020-11-29 ENCOUNTER — Encounter: Payer: Self-pay | Admitting: Podiatry

## 2020-11-29 DIAGNOSIS — Z008 Encounter for other general examination: Secondary | ICD-10-CM | POA: Diagnosis not present

## 2020-11-29 DIAGNOSIS — N1831 Chronic kidney disease, stage 3a: Secondary | ICD-10-CM | POA: Diagnosis not present

## 2020-11-29 DIAGNOSIS — E785 Hyperlipidemia, unspecified: Secondary | ICD-10-CM | POA: Diagnosis not present

## 2020-11-29 DIAGNOSIS — E1122 Type 2 diabetes mellitus with diabetic chronic kidney disease: Secondary | ICD-10-CM | POA: Diagnosis not present

## 2020-11-29 DIAGNOSIS — I129 Hypertensive chronic kidney disease with stage 1 through stage 4 chronic kidney disease, or unspecified chronic kidney disease: Secondary | ICD-10-CM | POA: Diagnosis not present

## 2020-11-29 DIAGNOSIS — M722 Plantar fascial fibromatosis: Secondary | ICD-10-CM | POA: Diagnosis not present

## 2020-11-29 NOTE — Progress Notes (Signed)
Subjective:  Patient ID: Alex Myers, male    DOB: 1946-06-18,  MRN: [id number]  Chief Complaint  Patient presents with  . Plantar Fasciitis    PT stated that he is still having some pain but its not as bad as it was    75 y.o. male presents with the above complaint.  Patient presents with a follow-up to left plantar fasciitis.  Patient states that the pain is still present in the heel has not gotten better.  He would like to discuss doing another steroid injection in the heel.  He denies any other acute complaints.   Review of Systems: Negative except as noted in the HPI. Denies N/V/F/Ch.  Past Medical History:  Diagnosis Date  . DM (diabetes mellitus) ([place]) 2011   DIET CONTROL  . HTN (hypertension)   . Hypercholesterolemia     Current Outpatient Medications:  .  amLODipine (NORVASC) 10 MG tablet, Take 1 tablet (10 mg total) by mouth daily., Disp: , Rfl:  .  Ascorbic Acid (VITAMIN C PO), Take 1 tablet by mouth daily., Disp: , Rfl:  .  atorvastatin (LIPITOR) 10 MG tablet, Take 10 mg by mouth daily., Disp: , Rfl:  .  brimonidine (ALPHAGAN) 0.2 % ophthalmic solution, SMARTSIG:In Eye(s), Disp: , Rfl:  .  CALCIUM PO, Take 1 tablet by mouth daily., Disp: , Rfl:  .  cephALEXin (KEFLEX) 500 MG capsule, Take 500 mg by mouth 2 (two) times daily., Disp: , Rfl:  .  dorzolamide-timolol (COSOPT) 22.3-6.8 MG/ML ophthalmic solution, Place 1 drop into the left eye 2 (two) times daily., Disp: , Rfl:  .  erythromycin ophthalmic ointment, Apply a small amount into the left eye socket three times a day after patch is removed., Disp: , Rfl:  .  ibuprofen (ADVIL) 800 MG tablet, Take 800 mg by mouth 3 (three) times daily., Disp: , Rfl:  .  latanoprost (XALATAN) 0.005 % ophthalmic solution, SMARTSIG:In Eye(s), Disp: , Rfl:  .  lisinopril (PRINIVIL,ZESTRIL) 10 MG tablet, Take 1 tablet (10 mg total) by mouth daily., Disp: , Rfl:  .  lisinopril (ZESTRIL) 30 MG tablet, Take 30 mg by mouth daily., Disp: ,  Rfl:  .  lisinopril-hydrochlorothiazide (ZESTORETIC) 20-12.5 MG tablet, Take 1 tablet by mouth daily., Disp: , Rfl:  .  moxifloxacin (VIGAMOX) 0.5 % ophthalmic solution, Apply to eye., Disp: , Rfl:  .  Multiple Vitamin (MULTIVITAMIN) tablet, Take 1 tablet by mouth daily., Disp: , Rfl:  .  oxyCODONE (OXY IR/ROXICODONE) 5 MG immediate release tablet, Take 5 mg by mouth 4 (four) times daily as needed., Disp: , Rfl:  .  prednisoLONE acetate (PRED FORTE) 1 % ophthalmic suspension, Place 1 drop into the left eye daily., Disp: , Rfl:  .  TURMERIC PO, Take 1 tablet by mouth daily., Disp: , Rfl:  .  VITAMIN E PO, Take 1 tablet by mouth daily., Disp: , Rfl:   Current Facility-Administered Medications:  .  0.9 %  sodium chloride infusion, 500 mL, Intravenous, Once, Danis, Kirke Corin, MD  Social History   Tobacco Use  Smoking Status Current Some Day Smoker  . Types: Cigars  Smokeless Tobacco Never Used    No Known Allergies Objective:  There were no vitals filed for this visit. There is no height or weight on file to calculate BMI. Constitutional Well developed. Well nourished.  Vascular Dorsalis pedis pulses palpable bilaterally. Posterior tibial pulses palpable bilaterally. Capillary refill normal to all digits.  No cyanosis or clubbing noted. Pedal  hair growth normal.  Neurologic Normal speech. Oriented to person, place, and time. Epicritic sensation to light touch grossly present bilaterally.  Dermatologic Nails well groomed and normal in appearance. No open wounds. No skin lesions.  Orthopedic:  Residual pain on palpation to the left plantar midfoot component of the plantar fascia.  Pain on palpation to the left calcaneal tuber medial heel.  pain at the calcaneal tuber.  Pain with dorsiflexion of the digits with.  Taut plantar fascia noted with dorsiflexion of the digits.  Positive Silfverskiold test.   Radiographs: 3 views of skeletally mature adult left foot: No fractures noted.  Severe pes planovalgus deformity noted.  Poor plantar heel spurring noted.  Midfoot arthritis noted.  No other bony abnormalities identified.  Assessment:   1. Plantar fasciitis of left foot   2. Heel spur, left    Plan:  Patient was evaluated and treated and all questions answered.  Left plantar fasciitis now at the calcaneal tuber underlying heel spur -I explained the patient the etiology of her pain that he is experiencing adverse treatment options were discussed.  Given that he had a traumatic event could likely lead to partial tear of the plantar fascia.  I believe he will benefit from a steroid injection to help decrease acute inflammatory component associated pain.  Patient agrees with the plan like to proceed with a steroid injection to the midfoot of the plantar fascia. -A third steroid injection was performed at left plantar heel using 1% plain Lidocaine and 10 mg of Kenalog. This was well tolerated. -I instructed him to go back into the plantar fascial brace. -Continue wearing shoes with orthotics. -If it does not improve with any further's conservative treatment options we will have to discuss surgical options during next clinical visit.  Patient states understanding   No follow-ups on file.

## 2020-12-14 DIAGNOSIS — Z4420 Encounter for fitting and adjustment of artificial eye, unspecified: Secondary | ICD-10-CM | POA: Diagnosis not present

## 2020-12-23 ENCOUNTER — Other Ambulatory Visit: Payer: Self-pay

## 2020-12-23 ENCOUNTER — Ambulatory Visit: Payer: Medicare Other | Admitting: Podiatry

## 2020-12-23 DIAGNOSIS — M7732 Calcaneal spur, left foot: Secondary | ICD-10-CM | POA: Diagnosis not present

## 2020-12-23 DIAGNOSIS — M722 Plantar fascial fibromatosis: Secondary | ICD-10-CM | POA: Diagnosis not present

## 2020-12-27 ENCOUNTER — Encounter: Payer: Self-pay | Admitting: Podiatry

## 2020-12-27 NOTE — Progress Notes (Signed)
Subjective:  Patient ID: Alex Myers, male    DOB: 11/13/1945,  MRN: [id number]  Chief Complaint  Patient presents with  . Plantar Fasciitis    PT stated that he was doing great until about a week and half ago he was playing golf and took a wrong step and it caused him some discomfort     75 y.o. male presents with the above complaint.  Patient presents with a follow-up to left plantar fasciitis.  Patient states that the pain is still present in the heel has not gotten better.  He would like to discuss doing another steroid injection in the heel.  He denies any other acute complaints.   Review of Systems: Negative except as noted in the HPI. Denies N/V/F/Ch.  Past Medical History:  Diagnosis Date  . DM (diabetes mellitus) ([place]) 2011   DIET CONTROL  . HTN (hypertension)   . Hypercholesterolemia     Current Outpatient Medications:  .  amLODipine (NORVASC) 10 MG tablet, Take 1 tablet (10 mg total) by mouth daily., Disp: , Rfl:  .  Ascorbic Acid (VITAMIN C PO), Take 1 tablet by mouth daily., Disp: , Rfl:  .  atorvastatin (LIPITOR) 10 MG tablet, Take 10 mg by mouth daily., Disp: , Rfl:  .  brimonidine (ALPHAGAN) 0.2 % ophthalmic solution, SMARTSIG:In Eye(s), Disp: , Rfl:  .  CALCIUM PO, Take 1 tablet by mouth daily., Disp: , Rfl:  .  cephALEXin (KEFLEX) 500 MG capsule, Take 500 mg by mouth 2 (two) times daily., Disp: , Rfl:  .  dorzolamide-timolol (COSOPT) 22.3-6.8 MG/ML ophthalmic solution, Place 1 drop into the left eye 2 (two) times daily., Disp: , Rfl:  .  erythromycin ophthalmic ointment, Apply a small amount into the left eye socket three times a day after patch is removed., Disp: , Rfl:  .  ibuprofen (ADVIL) 800 MG tablet, Take 800 mg by mouth 3 (three) times daily., Disp: , Rfl:  .  latanoprost (XALATAN) 0.005 % ophthalmic solution, SMARTSIG:In Eye(s), Disp: , Rfl:  .  lisinopril (PRINIVIL,ZESTRIL) 10 MG tablet, Take 1 tablet (10 mg total) by mouth daily., Disp: , Rfl:  .   lisinopril (ZESTRIL) 30 MG tablet, Take 30 mg by mouth daily., Disp: , Rfl:  .  lisinopril-hydrochlorothiazide (ZESTORETIC) 20-12.5 MG tablet, Take 1 tablet by mouth daily., Disp: , Rfl:  .  moxifloxacin (VIGAMOX) 0.5 % ophthalmic solution, Apply to eye., Disp: , Rfl:  .  Multiple Vitamin (MULTIVITAMIN) tablet, Take 1 tablet by mouth daily., Disp: , Rfl:  .  oxyCODONE (OXY IR/ROXICODONE) 5 MG immediate release tablet, Take 5 mg by mouth 4 (four) times daily as needed., Disp: , Rfl:  .  prednisoLONE acetate (PRED FORTE) 1 % ophthalmic suspension, Place 1 drop into the left eye daily., Disp: , Rfl:  .  TURMERIC PO, Take 1 tablet by mouth daily., Disp: , Rfl:  .  VITAMIN E PO, Take 1 tablet by mouth daily., Disp: , Rfl:   Current Facility-Administered Medications:  .  0.9 %  sodium chloride infusion, 500 mL, Intravenous, Once, Danis, Kirke Corin, MD  Social History   Tobacco Use  Smoking Status Current Some Day Smoker  . Types: Cigars  Smokeless Tobacco Never Used    No Known Allergies Objective:  There were no vitals filed for this visit. There is no height or weight on file to calculate BMI. Constitutional Well developed. Well nourished.  Vascular Dorsalis pedis pulses palpable bilaterally. Posterior tibial pulses palpable bilaterally.  Capillary refill normal to all digits.  No cyanosis or clubbing noted. Pedal hair growth normal.  Neurologic Normal speech. Oriented to person, place, and time. Epicritic sensation to light touch grossly present bilaterally.  Dermatologic Nails well groomed and normal in appearance. No open wounds. No skin lesions.  Orthopedic:  Residual pain on palpation to the left plantar midfoot component of the plantar fascia.  Pain on palpation to the left calcaneal tuber medial heel.  pain at the calcaneal tuber.  Pain with dorsiflexion of the digits with.  Taut plantar fascia noted with dorsiflexion of the digits.  Positive Silfverskiold test.    Radiographs: 3 views of skeletally mature adult left foot: No fractures noted. Severe pes planovalgus deformity noted.  Poor plantar heel spurring noted.  Midfoot arthritis noted.  No other bony abnormalities identified.  Assessment:   No diagnosis found. Plan:  Patient was evaluated and treated and all questions answered.  Left plantar fasciitis now at the calcaneal tuber underlying heel spur -I explained the patient the etiology of her pain that he is experiencing adverse treatment options were discussed.  Given that he had a traumatic event could likely lead to partial tear of the plantar fascia.  I believe he will benefit from a steroid injection to help decrease acute inflammatory component associated pain.  Patient agrees with the plan like to proceed with a steroid injection to the midfoot of the plantar fascia. - I will hold off on any further steroid injection. -I have instructed him to use a TENS unit which seems to be helping control his pain.  As long as it is helping manage the plantar fasciitis pain he can continue using it. -I instructed him to go back into the plantar fascial brace. -Continue wearing shoes with orthotics. -If it does not improve with any further's conservative treatment options we will have to discuss surgical options during next clinical visit.  Patient states understanding   No follow-ups on file.

## 2021-01-03 DIAGNOSIS — H9012 Conductive hearing loss, unilateral, left ear, with unrestricted hearing on the contralateral side: Secondary | ICD-10-CM | POA: Diagnosis not present

## 2021-01-03 DIAGNOSIS — H6982 Other specified disorders of Eustachian tube, left ear: Secondary | ICD-10-CM | POA: Diagnosis not present

## 2021-01-03 DIAGNOSIS — H6522 Chronic serous otitis media, left ear: Secondary | ICD-10-CM | POA: Diagnosis not present

## 2021-01-24 DIAGNOSIS — H409 Unspecified glaucoma: Secondary | ICD-10-CM | POA: Diagnosis not present

## 2021-01-24 DIAGNOSIS — H40051 Ocular hypertension, right eye: Secondary | ICD-10-CM | POA: Diagnosis not present

## 2021-02-08 ENCOUNTER — Ambulatory Visit: Payer: Medicare Other | Admitting: Podiatry

## 2021-06-16 DIAGNOSIS — Z4420 Encounter for fitting and adjustment of artificial eye, unspecified: Secondary | ICD-10-CM | POA: Diagnosis not present

## 2021-06-21 DIAGNOSIS — Z9001 Acquired absence of eye: Secondary | ICD-10-CM | POA: Diagnosis not present

## 2021-06-21 DIAGNOSIS — H5440 Blindness, one eye, unspecified eye: Secondary | ICD-10-CM | POA: Diagnosis not present

## 2021-06-21 DIAGNOSIS — H5712 Ocular pain, left eye: Secondary | ICD-10-CM | POA: Diagnosis not present

## 2021-06-21 DIAGNOSIS — H11442 Conjunctival cysts, left eye: Secondary | ICD-10-CM | POA: Diagnosis not present

## 2021-07-04 DIAGNOSIS — H11442 Conjunctival cysts, left eye: Secondary | ICD-10-CM | POA: Diagnosis not present

## 2021-07-12 DIAGNOSIS — Z79899 Other long term (current) drug therapy: Secondary | ICD-10-CM | POA: Diagnosis not present

## 2021-07-12 DIAGNOSIS — I70203 Unspecified atherosclerosis of native arteries of extremities, bilateral legs: Secondary | ICD-10-CM | POA: Diagnosis not present

## 2021-07-12 DIAGNOSIS — H409 Unspecified glaucoma: Secondary | ICD-10-CM | POA: Diagnosis not present

## 2021-07-12 DIAGNOSIS — H5010 Unspecified exotropia: Secondary | ICD-10-CM | POA: Diagnosis not present

## 2021-07-12 DIAGNOSIS — Z0001 Encounter for general adult medical examination with abnormal findings: Secondary | ICD-10-CM | POA: Diagnosis not present

## 2021-07-12 DIAGNOSIS — I10 Essential (primary) hypertension: Secondary | ICD-10-CM | POA: Diagnosis not present

## 2021-07-12 DIAGNOSIS — E1151 Type 2 diabetes mellitus with diabetic peripheral angiopathy without gangrene: Secondary | ICD-10-CM | POA: Diagnosis not present

## 2021-07-12 DIAGNOSIS — E785 Hyperlipidemia, unspecified: Secondary | ICD-10-CM | POA: Diagnosis not present

## 2021-10-05 DIAGNOSIS — H7203 Central perforation of tympanic membrane, bilateral: Secondary | ICD-10-CM | POA: Diagnosis not present

## 2021-10-05 DIAGNOSIS — H6983 Other specified disorders of Eustachian tube, bilateral: Secondary | ICD-10-CM | POA: Diagnosis not present

## 2021-10-05 DIAGNOSIS — H6123 Impacted cerumen, bilateral: Secondary | ICD-10-CM | POA: Diagnosis not present

## 2021-10-17 DIAGNOSIS — Z4420 Encounter for fitting and adjustment of artificial eye, unspecified: Secondary | ICD-10-CM | POA: Diagnosis not present

## 2021-11-17 ENCOUNTER — Ambulatory Visit
Admission: RE | Admit: 2021-11-17 | Discharge: 2021-11-17 | Disposition: A | Discharge status: Home / Self Care | Payer: Medicare Other | Source: Ambulatory Visit | Attending: Family Medicine | Admitting: Family Medicine

## 2021-11-17 ENCOUNTER — Other Ambulatory Visit: Payer: Self-pay | Admitting: Family Medicine

## 2021-11-17 DIAGNOSIS — R001 Bradycardia, unspecified: Secondary | ICD-10-CM

## 2021-11-30 ENCOUNTER — Ambulatory Visit: Payer: Medicare Other | Admitting: Interventional Cardiology

## 2021-11-30 ENCOUNTER — Other Ambulatory Visit: Payer: Self-pay

## 2021-11-30 ENCOUNTER — Encounter: Payer: Self-pay | Admitting: Interventional Cardiology

## 2021-11-30 VITALS — BP 142/70 | HR 87 | Ht 67.0 in | Wt 224.6 lb

## 2021-11-30 DIAGNOSIS — I10 Essential (primary) hypertension: Secondary | ICD-10-CM | POA: Diagnosis not present

## 2021-11-30 DIAGNOSIS — R072 Precordial pain: Secondary | ICD-10-CM | POA: Diagnosis not present

## 2021-11-30 DIAGNOSIS — E782 Mixed hyperlipidemia: Secondary | ICD-10-CM | POA: Diagnosis not present

## 2021-11-30 NOTE — Patient Instructions (Signed)
Medication Instructions:  ?Your physician recommends that you continue on your current medications as directed. Please refer to the Current Medication list given to you today. ? ?*If you need a refill on your cardiac medications before your next appointment, please call your pharmacy* ? ? ?Lab Work: ?none ?If you have labs (blood work) drawn today and your tests are completely normal, you will receive your results only by: ?MyChart Message (if you have MyChart) OR ?A paper copy in the mail ?If you have any lab test that is abnormal or we need to change your treatment, we will call you to review the results. ? ? ?Testing/Procedures: ?Your physician has requested that you have an echocardiogram. Echocardiography is a painless test that uses sound waves to create images of your heart. It provides your doctor with information about the size and shape of your heart and how well your heart?s chambers and valves are working. This procedure takes approximately one hour. There are no restrictions for this procedure. ? ? ? ?Follow-Up: ?At [facility], you and your health needs are our priority.  As part of our continuing mission to provide you with exceptional heart care, we have created designated Provider Care Teams.  These Care Teams include your primary Cardiologist (physician) and Advanced Practice Providers (APPs -  Physician Assistants and Nurse Practitioners) who all work together to provide you with the care you need, when you need it. ? ?We recommend signing up for the patient portal called "MyChart".  Sign up information is provided on this After Visit Summary.  MyChart is used to connect with patients for Virtual Visits (Telemedicine).  Patients are able to view lab/test results, encounter notes, upcoming appointments, etc.  Non-urgent messages can be sent to your provider as well.   ?To learn more about what you can do with MyChart, go to [email or web address].   ? ?Your next appointment:   ?12  month(s) ? ?The format for your next appointment:   ?In Person ? ?Provider:   ?Larae Grooms, MD   ? ? ?Other Instructions ?  ? ?

## 2021-11-30 NOTE — Progress Notes (Signed)
?  ?Cardiology Office Note ? ? ?Date:  11/30/2021  ? ?ID:  Alex Myers, DOB 1946-06-06, MRN [id number] ? ?PCP:  Roselee Nova, MD  ? ? ?No chief complaint on file. ? ?HTN ? ?Wt Readings from Last 3 Encounters:  ?11/30/21 224 lb 9.6 oz (101.9 kg)  ?03/03/18 231 lb (104.8 kg)  ?02/17/18 231 lb (104.8 kg)  ?  ? ?  ?History of Present Illness: ?Alex Myers is a 76 y.o. male who is being seen today for the evaluation of chest discomfort at the request of Antonietta Jewel, MD.  ? ?He saw Dr. Mare Ferrari in 2006. "Back in 2006 he had a stress test performed at [facility] cardiology. No ischemia, normal EF."  ? ?In 2018, records showed that he had gotten up to 282 and developed DM.  He weaned off of meds after losing 60 lbs. "At that time, he was taking for blood pressure medications, amlodipine 10 mg once a day, hydrochlorothiazide 25 mg once a day, lisinopril 10 mg once a day and atenolol 50 mg once a day. He had been on these medications for several years." ? ?In general blood pressures have been pretty well controlled in the 110s to 993Z range systolic while on L-arginine.  When the bee pollen was substituted for L-arginine, The systolic blood pressure is increased to the 140s to 150s range.  Patient states that his primary care doctor was concerned about the low diastolic blood pressures.  He has had some wide pulse pressure in the past. ? ?He reports a mid sternal chest pain very rarely, when he takes a deep breath.  Not related to exertion.  He does note that HR increases and stays elevated after exercise such as playing golf.  Tw hours later, HR has been elevated at times.  ? ?He does water aerobics 3x/week, no cardiac sx. ? ?Denies : exertional Chest pain. Dizziness. Leg edema. Nitroglycerin use. Orthopnea. Palpitations. Paroxysmal nocturnal dyspnea. Shortness of breath. Syncope.   ? ?Past Medical History:  ?Diagnosis Date  ? Bradycardia   ? DM (diabetes mellitus) ([place]) 2011  ? DIET CONTROL  ? HTN (hypertension)   ?  Hypercholesterolemia   ? ? ?Past Surgical History:  ?Procedure Laterality Date  ? APPENDECTOMY  1970  ? COLONOSCOPY  2008  ? [facility] =normal exam per pt  ? CORNEAL TRANSPLANT    ? EYE SURGERY    ? 8 total since Feb. 2018  ? INNER EAR SURGERY    ? ? ? ?Current Outpatient Medications  ?Medication Sig Dispense Refill  ? amLODipine (NORVASC) 10 MG tablet Take 1 tablet (10 mg total) by mouth daily.    ? Ascorbic Acid (VITAMIN C PO) Take 1 tablet by mouth daily.    ? atorvastatin (LIPITOR) 10 MG tablet Take 10 mg by mouth daily.    ? CALCIUM PO Take 1 tablet by mouth daily.    ? dorzolamide-timolol (COSOPT) 22.3-6.8 MG/ML ophthalmic solution 1 drop 2 (two) times daily.    ? latanoprost (XALATAN) 0.005 % ophthalmic solution SMARTSIG:In Eye(s)    ? lisinopril-hydrochlorothiazide (ZESTORETIC) 20-12.5 MG tablet Take 1 tablet by mouth daily.    ? Multiple Vitamin (MULTIVITAMIN) tablet Take 1 tablet by mouth daily.    ? TURMERIC PO Take 1 tablet by mouth daily.    ? VITAMIN E PO Take 1 tablet by mouth daily.    ? ?Current Facility-Administered Medications  ?Medication Dose Route Frequency Provider Last Rate Last Admin  ? 0.9 %  sodium  chloride infusion  500 mL Intravenous Once Doran Stabler, MD      ? ? ?Allergies:   Patient has no known allergies.  ? ? ?Social History:  The patient  reports that he has been smoking cigars. He has never used smokeless tobacco. He reports current alcohol use. He reports that he does not use drugs.  ? ?Family History:  The patient's family history includes Hernia in his brother; Hyperlipidemia in his sister; Hypertension (age of onset: 78) in his mother; Other (age of onset: 85) in his father.  ? ? ?ROS:  Please see the history of present illness.   Otherwise, review of systems are positive for palpitations.   All other systems are reviewed and negative.  ? ? ?PHYSICAL EXAM: ?VS:  BP (!) 142/70   Pulse 87   Ht '5\' 7"'$  (1.702 m)   Wt 224 lb 9.6 oz (101.9 kg)   SpO2 98%   BMI 35.18 kg/m?  ,  BMI Body mass index is 35.18 kg/m?. ?GEN: Well nourished, well developed, in no acute distress ?HEENT: normal ?Neck: no JVD, carotid bruits, or masses ?Cardiac: RRR; no murmurs, rubs, or gallops,no edema  ?Respiratory:  clear to auscultation bilaterally, normal work of breathing ?GI: soft, nontender, nondistended, + BS ?MS: no deformity or atrophy ?Skin: warm and dry, no rash ?Neuro:  Strength and sensation are intact ?Psych: euthymic mood, full affect ? ? ?EKG:   ?The ekg ordered today demonstrates NSR, nonspecific ST depressions ? ? ?Recent Labs: ?No results found for requested labs within last 8760 hours.  ? ?Lipid Panel ?No results found for: CHOL, TRIG, HDL, CHOLHDL, VLDL, LDLCALC, LDLDIRECT ?  ?Other studies Reviewed: ?Additional studies/ records that were reviewed today with results demonstrating: Records from primary care doctor reviewed. ? ? ?ASSESSMENT AND PLAN: ? ?Hypertension: Continue current medications.  Continue regular exercise.  Minimize salt and processed foods in his diet.  We will check echocardiogram.  He does have a wide pulse pressure on some of his home readings.  Low diastolics in the low 54T. ?Hyperlipidemia: Continue atorvastatin.  Lipids followed by his primary care doctor ?Chest pain: Noted only with a deep breath.  He he states it happens once every 4 to 6 weeks.  Not related to exertion.  We discussed stress testing given his risk factors.  He would like to hold off.  I think this is reasonable.  If his exercise tolerance drops or he develops exertional symptoms, would reconsider. ? ? ?Current medicines are reviewed at length with the patient today.  The patient concerns regarding his medicines were addressed. ? ?The following changes have been made:  decrease bee pollen to 2 caps ? ?Labs/ tests ordered today include:  ?No orders of the defined types were placed in this encounter. ? ? ?Recommend 150 minutes/week of aerobic exercise ?Low fat, low carb, high fiber diet  recommended ? ?Disposition:   FU in 1 year or sooner if he has symptoms ? ? ?Signed, ?Larae Grooms, MD  ?11/30/2021 3:08 PM    ?[place] ?[address], [place], [place]  [address] ?Phone: [phone]; Fax: [phone]  ? ?

## 2021-12-13 ENCOUNTER — Ambulatory Visit (HOSPITAL_COMMUNITY): Payer: Medicare Other | Attending: Cardiology

## 2021-12-13 DIAGNOSIS — I10 Essential (primary) hypertension: Secondary | ICD-10-CM | POA: Diagnosis not present

## 2021-12-13 LAB — ECHOCARDIOGRAM COMPLETE
Area-P 1/2: 3.65 cm2
S' Lateral: 2.8 cm

## 2022-09-14 ENCOUNTER — Ambulatory Visit
Admission: RE | Admit: 2022-09-14 | Discharge: 2022-09-14 | Disposition: A | Discharge status: Home / Self Care | Payer: Medicare Other | Source: Ambulatory Visit | Attending: Family Medicine | Admitting: Family Medicine

## 2022-09-14 ENCOUNTER — Other Ambulatory Visit: Payer: Self-pay | Admitting: Family Medicine

## 2022-09-14 DIAGNOSIS — M25511 Pain in right shoulder: Secondary | ICD-10-CM

## 2022-12-26 ENCOUNTER — Encounter: Payer: Self-pay | Admitting: Gastroenterology

## 2023-01-10 ENCOUNTER — Encounter: Payer: Self-pay | Admitting: Gastroenterology

## 2023-01-22 ENCOUNTER — Ambulatory Visit (AMBULATORY_SURGERY_CENTER): Payer: Medicare Other

## 2023-01-22 ENCOUNTER — Encounter: Payer: Self-pay | Admitting: Gastroenterology

## 2023-01-22 VITALS — Ht 67.0 in | Wt 215.0 lb

## 2023-01-22 DIAGNOSIS — Z86010 Personal history of colonic polyps: Secondary | ICD-10-CM

## 2023-01-22 MED ORDER — NA SULFATE-K SULFATE-MG SULF 17.5-3.13-1.6 GM/177ML PO SOLN
1.0000 | Freq: Once | ORAL | 0 refills | Status: AC
Start: 2023-01-22 — End: 2023-01-22

## 2023-01-22 NOTE — Progress Notes (Signed)

## 2023-02-19 ENCOUNTER — Ambulatory Visit (AMBULATORY_SURGERY_CENTER): Payer: Medicare Other | Admitting: Gastroenterology

## 2023-02-19 ENCOUNTER — Encounter: Payer: Self-pay | Admitting: Gastroenterology

## 2023-02-19 VITALS — BP 141/60 | HR 63 | Temp 99.6°F | Resp 15 | Ht 67.0 in | Wt 215.0 lb

## 2023-02-19 DIAGNOSIS — Z09 Encounter for follow-up examination after completed treatment for conditions other than malignant neoplasm: Secondary | ICD-10-CM

## 2023-02-19 DIAGNOSIS — Z86010 Personal history of colonic polyps: Secondary | ICD-10-CM

## 2023-02-19 MED ORDER — SODIUM CHLORIDE 0.9 % IV SOLN
500.0000 mL | Freq: Once | INTRAVENOUS | Status: DC
Start: 2023-02-19 — End: 2023-02-19

## 2023-02-19 NOTE — Progress Notes (Signed)
History and Physical:  This patient presents for endoscopic testing for: Encounter Diagnosis  Name Primary?   Personal history of colonic polyps Yes    Diminutive SSP x 16 February 2018 Patient denies chronic abdominal pain, rectal bleeding, constipation or diarrhea.   Patient is otherwise without complaints or active issues today.   Past Medical History: Past Medical History:  Diagnosis Date   Bradycardia    DM (diabetes mellitus) ([facility]) 2011   DIET CONTROL   HTN (hypertension)    Hypercholesterolemia      Past Surgical History: Past Surgical History:  Procedure Laterality Date   APPENDECTOMY  1970   COLONOSCOPY  2008   [facility] =normal exam per pt   CORNEAL TRANSPLANT     EYE SURGERY     8 total since Feb. 2018   INNER EAR SURGERY      Allergies: No Known Allergies  Outpatient Meds: Current Outpatient Medications  Medication Sig Dispense Refill   amLODipine (NORVASC) 10 MG tablet Take 1 tablet (10 mg total) by mouth daily.     Ascorbic Acid (VITAMIN C PO) Take 1 tablet by mouth daily.     aspirin 81 MG chewable tablet Chew 81 mg by mouth in the morning and at bedtime.     atorvastatin (LIPITOR) 10 MG tablet Take 10 mg by mouth daily.     CALCIUM PO Take 1 tablet by mouth daily.     co-enzyme Q-10 30 MG capsule Take 30 mg by mouth 3 (three) times daily.     dorzolamide-timolol (COSOPT) 22.3-6.8 MG/ML ophthalmic solution 1 drop 2 (two) times daily.     L-Arginine 1000 MG TABS Take by mouth.     latanoprost (XALATAN) 0.005 % ophthalmic solution SMARTSIG:In Eye(s)     lisinopril-hydrochlorothiazide (ZESTORETIC) 20-12.5 MG tablet Take 1 tablet by mouth daily.     Multiple Vitamin (MULTIVITAMIN) tablet Take 1 tablet by mouth daily.     tamsulosin (FLOMAX) 0.4 MG CAPS capsule Take 0.4 mg by mouth daily.     TURMERIC PO Take 1 tablet by mouth daily.     VITAMIN E PO Take 1 tablet by mouth daily.     Current Facility-Administered Medications  Medication Dose Route Frequency  Provider Last Rate Last Admin   0.9 %  sodium chloride infusion  500 mL Intravenous Once Danis, Starr Lake III, MD       0.9 %  sodium chloride infusion  500 mL Intravenous Once Sherrilyn Rist, MD          ___________________________________________________________________ Objective   Exam:  BP (!) 164/67   Pulse 60   Temp 99.6 F (37.6 C)   Ht 5\' 7"  (1.702 m)   Wt 215 lb (97.5 kg)   SpO2 100%   BMI 33.67 kg/m   CV: regular , S1/S2 Resp: clear to auscultation bilaterally, normal RR and effort noted GI: soft, no tenderness, with active bowel sounds.   Assessment: Encounter Diagnosis  Name Primary?   Personal history of colonic polyps Yes     Plan: Colonoscopy   The benefits and risks of the planned procedure were described in detail with the patient or (when appropriate) their health care proxy.  Risks were outlined as including, but not limited to, bleeding, infection, perforation, adverse medication reaction leading to cardiac or pulmonary decompensation, pancreatitis (if ERCP).  The limitation of incomplete mucosal visualization was also discussed.  No guarantees or warranties were given.  The patient is appropriate for an endoscopic procedure in  the ambulatory setting.   - Amada Jupiter, MD

## 2023-02-19 NOTE — Patient Instructions (Addendum)
Continue present medications. No repeat surveillance colonoscopy recommended based on age and current guidelines.                            YOU HAD AN ENDOSCOPIC PROCEDURE TODAY AT THE [facility]:   Refer to the procedure report that was given to you for any specific questions about what was found during the examination.  If the procedure report does not answer your questions, please call your gastroenterologist to clarify.  If you requested that your care partner not be given the details of your procedure findings, then the procedure report has been included in a sealed envelope for you to review at your convenience later.  YOU SHOULD EXPECT: Some feelings of bloating in the abdomen. Passage of more gas than usual.  Walking can help get rid of the air that was put into your GI tract during the procedure and reduce the bloating. If you had a lower endoscopy (such as a colonoscopy or flexible sigmoidoscopy) you may notice spotting of blood in your stool or on the toilet paper. If you underwent a bowel prep for your procedure, you may not have a normal bowel movement for a few days.  Please Note:  You might notice some irritation and congestion in your nose or some drainage.  This is from the oxygen used during your procedure.  There is no need for concern and it should clear up in a day or so.  SYMPTOMS TO REPORT IMMEDIATELY:  Following lower endoscopy (colonoscopy or flexible sigmoidoscopy):  Excessive amounts of blood in the stool  Significant tenderness or worsening of abdominal pains  Swelling of the abdomen that is new, acute  Fever of 100F or higher For urgent or emergent issues, a gastroenterologist can be reached at any hour by calling [phone]. Do not use MyChart messaging for urgent concerns.    DIET:  We do recommend a small meal at first, but then you may proceed to your regular diet.  Drink plenty of fluids but you should avoid alcoholic beverages for 24  hours.  ACTIVITY:  You should plan to take it easy for the rest of today and you should NOT DRIVE or use heavy machinery until tomorrow (because of the sedation medicines used during the test).    FOLLOW UP: Our staff will call the number listed on your records the next business day following your procedure.  We will call around 7:15- 8:00 am to check on you and address any questions or concerns that you may have regarding the information given to you following your procedure. If we do not reach you, we will leave a message.      SIGNATURES/CONFIDENTIALITY: You and/or your care partner have signed paperwork which will be entered into your electronic medical record.  These signatures attest to the fact that that the information above on your After Visit Summary has been reviewed and is understood.  Full responsibility of the confidentiality of this discharge information lies with you and/or your care-partner.

## 2023-02-19 NOTE — Progress Notes (Signed)
Pt's states no medical or surgical changes since previsit or office visit. 

## 2023-02-19 NOTE — Op Note (Signed)
[facility] Patient Name: Alex Myers Procedure Date: 02/19/2023 11:28 AM MRN: [id number] Endoscopist: Sherilyn Cooter L. Myrtie Neither , MD, [id number] Age: 77 Referring MD:  Date of Birth: 22-Aug-1946 Gender: Male Account #: [id number] Procedure:                Colonoscopy Indications:              Increased risk colon cancer surveillance: Personal                            history of sessile serrated colon polyp (less than                            10 mm in size) with no dysplasia (two diminutive                            SSP polyps June 2019). no polyps 2008 Medicines:                Monitored Anesthesia Care Procedure:                Pre-Anesthesia Assessment:                           - Prior to the procedure, a History and Physical                            was performed, and patient medications and                            allergies were reviewed. The patient's tolerance of                            previous anesthesia was also reviewed. The risks                            and benefits of the procedure and the sedation                            options and risks were discussed with the patient.                            All questions were answered, and informed consent                            was obtained. Prior Anticoagulants: The patient has                            taken no anticoagulant or antiplatelet agents. ASA                            Grade Assessment: III - A patient with severe                            systemic disease. After reviewing the risks and  benefits, the patient was deemed in satisfactory                            condition to undergo the procedure.                           After obtaining informed consent, the colonoscope                            was passed under direct vision. Throughout the                            procedure, the patient's blood pressure, pulse, and                            oxygen saturations  were monitored continuously. The                            Olympus CF-HQ190L [id number]) Colonoscope was                            introduced through the anus and advanced to the the                            cecum, identified by appendiceal orifice and                            ileocecal valve. The colonoscopy was performed with                            difficulty due to a redundant colon and significant                            looping. Successful completion of the procedure was                            aided by using manual pressure and straightening                            and shortening the scope to obtain bowel loop                            reduction. The patient tolerated the procedure                            well. The quality of the bowel preparation was                            good. The ileocecal valve, appendiceal orifice, and                            rectum were photographed. Scope In: 11:34:27 AM Scope Out: 11:52:15 AM Scope Withdrawal Time: 0 hours 9 minutes 40 seconds  Total Procedure Duration: 0  hours 17 minutes 48 seconds  Findings:                 The perianal and digital rectal examinations were                            normal.                           Repeat examination of right colon under NBI                            performed.                           A few diverticula were found in the left colon.                            Entire colon significantly redundant.                           The exam was otherwise without abnormality on                            direct and retroflexion views. Complications:            No immediate complications. Estimated Blood Loss:     Estimated blood loss: none. Impression:               - Diverticulosis in the left colon. Redundant colon.                           - The examination was otherwise normal on direct                            and retroflexion views.                           - No specimens  collected. Recommendation:           - Patient has a contact number available for                            emergencies. The signs and symptoms of potential                            delayed complications were discussed with the                            patient. Return to normal activities tomorrow.                            Written discharge instructions were provided to the                            patient.                           -  Resume previous diet.                           - Continue present medications.                           - No repeat surveillance colonoscopy recommended                            based on age and current guidelines. Kenichi Cassada L. Myrtie Neither, MD 02/19/2023 11:56:42 AM This report has been signed electronically.

## 2023-02-19 NOTE — Progress Notes (Signed)
Vss nad trans to pacu 

## 2023-02-20 ENCOUNTER — Telehealth: Payer: Self-pay

## 2023-02-20 NOTE — Telephone Encounter (Signed)
  Follow up Call-     02/19/2023   10:43 AM  Call back number  Post procedure Call Back phone  # [phone]  Permission to leave phone message Yes     Patient questions:  Do you have a fever, pain , or abdominal swelling? No. Pain Score  0 *  Have you tolerated food without any problems? Yes.    Have you been able to return to your normal activities? Yes.    Do you have any questions about your discharge instructions: Diet   No. Medications  No. Follow up visit  No.  Do you have questions or concerns about your Care? No.  Actions: * If pain score is 4 or above: No action needed, pain <4.

## 2023-10-13 IMAGING — DX DG CHEST 2V
2 series · 2 of 2 positions shown · non-contrast
Comparison: None.

CLINICAL DATA: Bradycardia

EXAM:
CHEST - 2 VIEW

[dg chest 2 view (1 of 2)]
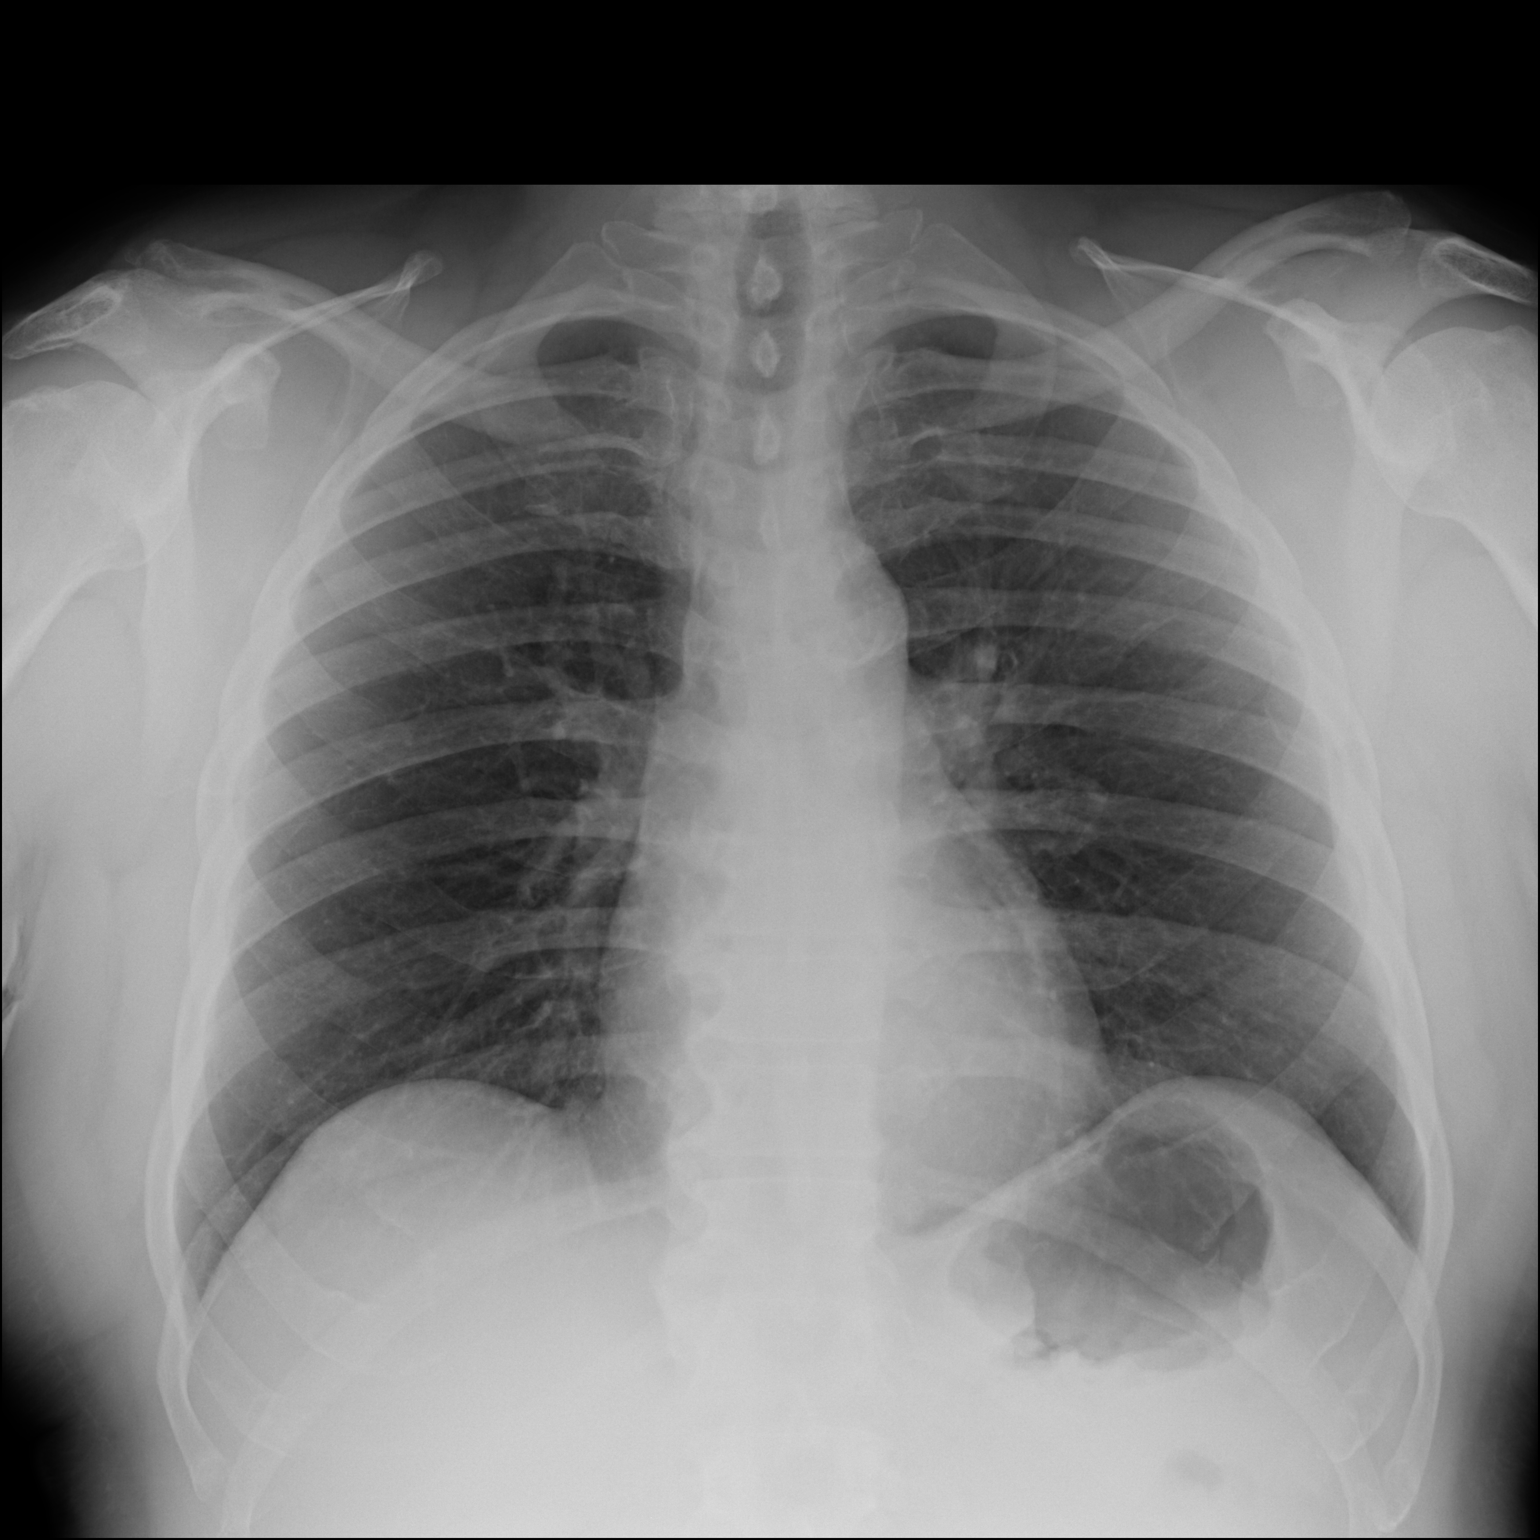

[dg chest 2 view (2 of 2)]
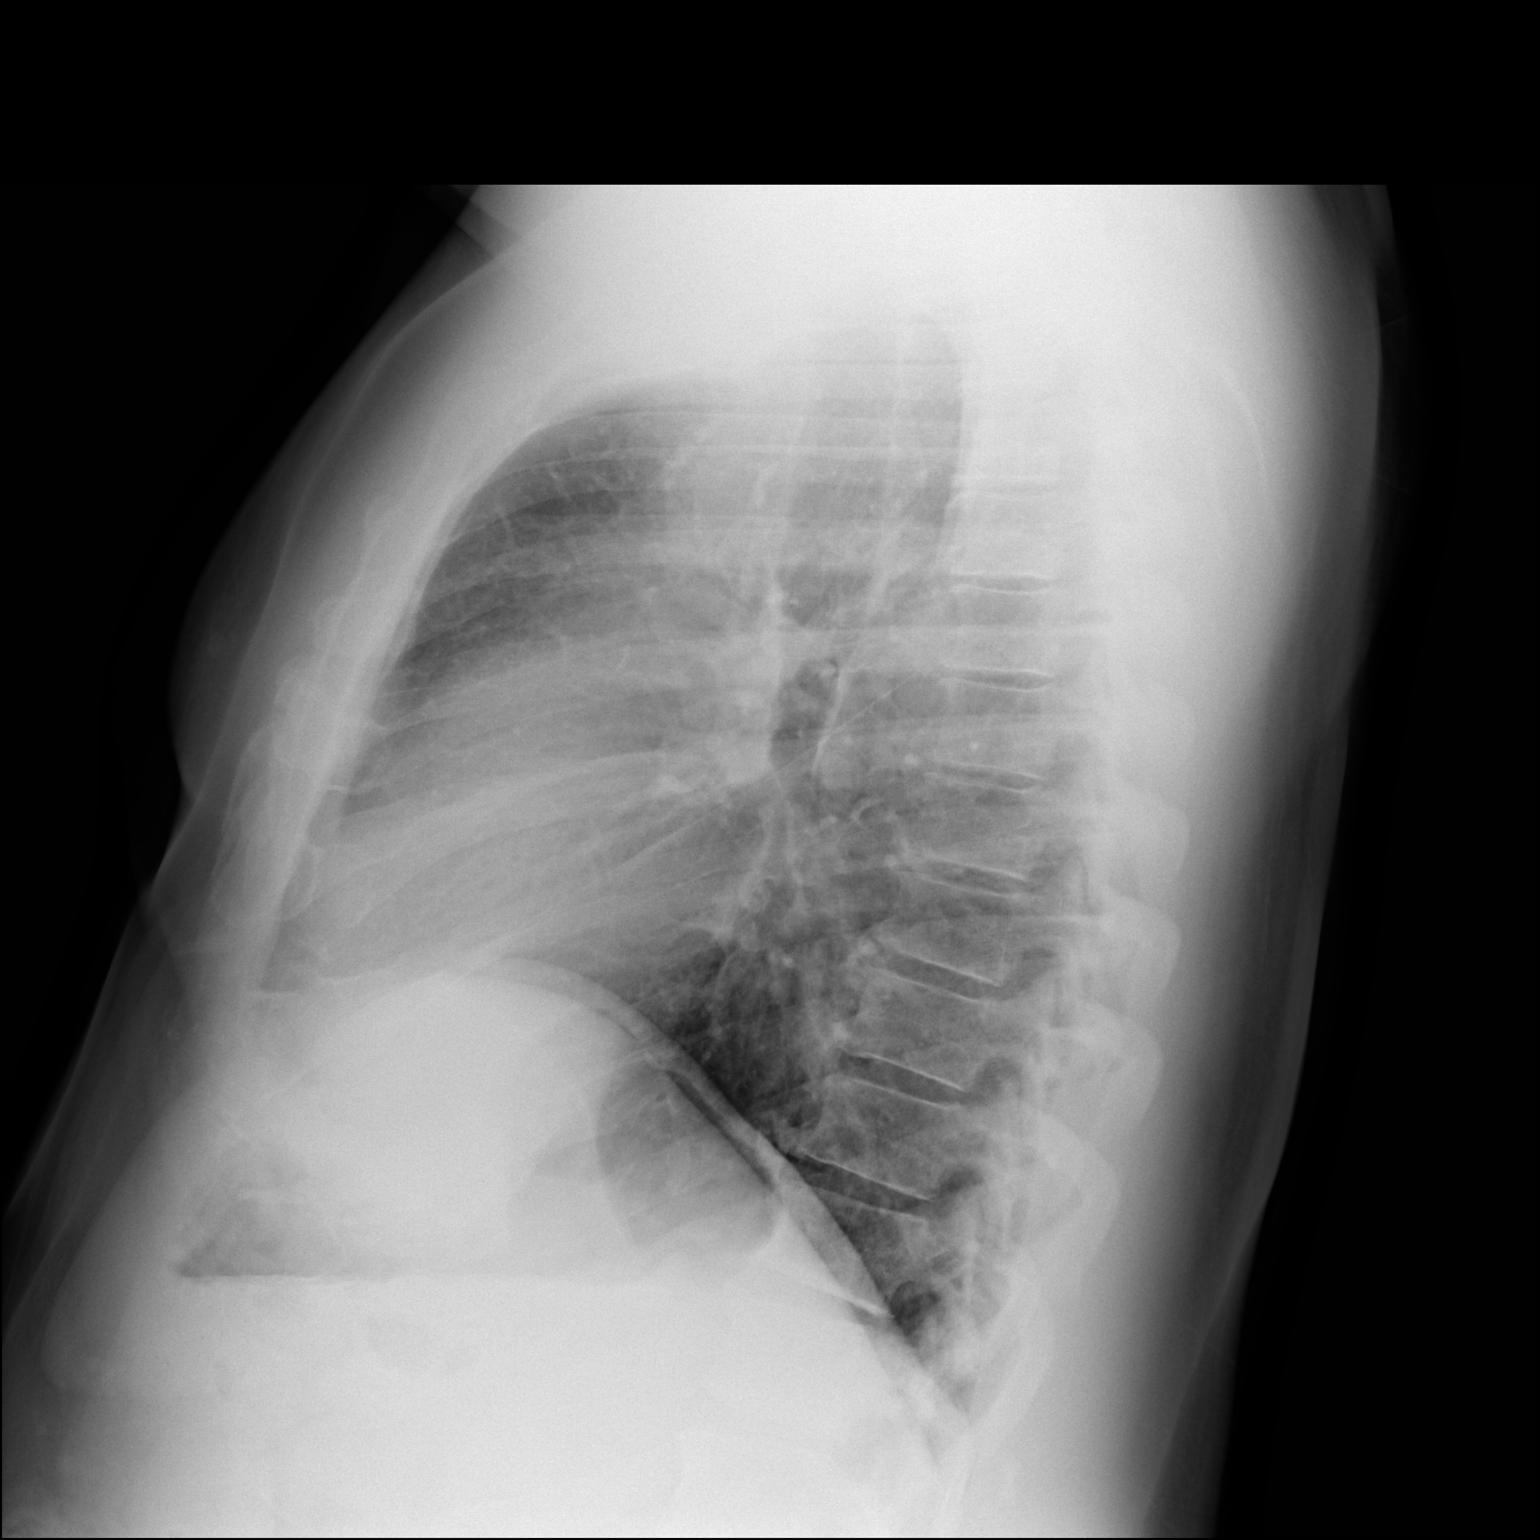

[2 of 2 positions shown; findings below may reference images not displayed]

FINDINGS: Normal study.
IMPRESSION: No active cardiopulmonary disease.

## 2024-06-08 ENCOUNTER — Other Ambulatory Visit: Payer: Self-pay | Admitting: Family Medicine

## 2024-06-08 DIAGNOSIS — R410 Disorientation, unspecified: Secondary | ICD-10-CM

## 2024-06-19 ENCOUNTER — Ambulatory Visit
Admission: RE | Admit: 2024-06-19 | Discharge: 2024-06-19 | Disposition: A | Discharge status: Home / Self Care | Payer: Medicare (Managed Care) | Source: Ambulatory Visit | Attending: Family Medicine | Admitting: Family Medicine

## 2024-06-19 DIAGNOSIS — R410 Disorientation, unspecified: Secondary | ICD-10-CM
